# Patient Record
Sex: Male | Born: 1965 | Race: Black or African American | Hispanic: No | Marital: Married | Smoking: Current every day smoker
Health system: Southern US, Community
[De-identification: ages and names within clinical notes are randomized; demographics above are authoritative.]

## PROBLEM LIST (undated history)

## (undated) DIAGNOSIS — M4712 Other spondylosis with myelopathy, cervical region: Secondary | ICD-10-CM

## (undated) DIAGNOSIS — R748 Abnormal levels of other serum enzymes: Secondary | ICD-10-CM

## (undated) DIAGNOSIS — E119 Type 2 diabetes mellitus without complications: Secondary | ICD-10-CM

## (undated) DIAGNOSIS — E785 Hyperlipidemia, unspecified: Secondary | ICD-10-CM

## (undated) DIAGNOSIS — I10 Essential (primary) hypertension: Secondary | ICD-10-CM

## (undated) HISTORY — PX: OTHER SURGICAL HISTORY: SHX169

---

## 2017-01-23 ENCOUNTER — Encounter (HOSPITAL_COMMUNITY): Payer: Self-pay | Admitting: Emergency Medicine

## 2017-01-23 ENCOUNTER — Emergency Department (HOSPITAL_COMMUNITY)
Admission: EM | Admit: 2017-01-23 | Discharge: 2017-01-23 | Disposition: A | Discharge status: Home / Self Care | Payer: Medicaid Other | Attending: Emergency Medicine | Admitting: Emergency Medicine

## 2017-01-23 DIAGNOSIS — F17210 Nicotine dependence, cigarettes, uncomplicated: Secondary | ICD-10-CM | POA: Insufficient documentation

## 2017-01-23 DIAGNOSIS — F10230 Alcohol dependence with withdrawal, uncomplicated: Secondary | ICD-10-CM | POA: Insufficient documentation

## 2017-01-23 DIAGNOSIS — F1020 Alcohol dependence, uncomplicated: Secondary | ICD-10-CM

## 2017-01-23 MED ORDER — CHLORDIAZEPOXIDE HCL 25 MG PO CAPS: ORAL_CAPSULE | ORAL | 0 refills | Status: DC

## 2017-01-23 MED ORDER — NICOTINE 21 MG/24HR TD PT24: 21.0000 mg | MEDICATED_PATCH | Freq: Every day | TRANSDERMAL | 0 refills | Status: DC

## 2017-01-23 NOTE — ED Provider Notes (Signed)
[facility]-EMERGENCY DEPT Provider Note   CSN: [id number] Arrival date & time: 01/23/17  1404  By signing my name below, I, Rosario Adie, attest that this documentation has been prepared under the direction and in the presence of [facility], PA-C.  Electronically Signed: Rosario Adie, ED Scribe. 01/23/17. 2:57 PM.  History   Chief Complaint Chief Complaint  Patient presents with  . detox   The history is provided by the patient and a friend. No language interpreter was used.    HPI Comments: Marcus James is a 51 y.o. male with no pertinent PMHx, who presents to the Emergency Department requesting detoxification from alcohol and nicotine. Per pt's pastor, he is bringing him in today because he has been drinking increased amounts of alcohol and smoking more frequently and this has been causing the pt to be have spells of anger and is motivated to not live his life this way anymore.  Is causing problems with his home life, prompting him to ask him to bring him to ED. Per pt, he drinks approximately four 40oz bottles of beer a day and he smokes approximately 1 ppd. He has previously attempted to detox for a small amount of time, and this has caused some unsteadiness and tremors; he denies any of these symptoms presently. His last drink was earlier this morning at 8AM (7 hours ago). Pt is currently followed by a PCP. He denies SI/HI, seizures, or any other associated symptoms.   History reviewed. No pertinent past medical history.  There are no active problems to display for this patient.  History reviewed. No pertinent surgical history.  Home Medications    Prior to Admission medications   Medication Sig Start Date End Date Taking? Authorizing Provider  chlordiazePOXIDE (LIBRIUM) 25 MG capsule  PO TID x 2D, then 25-50mg  PO BID X 1D, then 25-50mg  PO QD X 1D 01/23/17   Trixie Dredge, PA-C  nicotine (NICODERM CQ - DOSED IN MG/24 HOURS) 21 mg/24hr patch Place 1 patch (21 mg total)  onto the skin daily. 01/23/17   Trixie Dredge, PA-C   Family History History reviewed. No pertinent family history.  Social History Social History  Substance Use Topics  . Smoking status: Current Every Day Smoker  . Smokeless tobacco: Never Used  . Alcohol use Yes   Allergies   Patient has no known allergies.  Review of Systems Review of Systems  Constitutional: Negative for activity change.  Gastrointestinal: Negative for nausea and vomiting.  Musculoskeletal: Negative for myalgias.  Neurological: Negative for tremors and seizures.  Psychiatric/Behavioral: Positive for agitation and dysphoric mood. Negative for suicidal ideas. The patient is not nervous/anxious.    Physical Exam Updated Vital Signs BP 135/87 (BP Location: Left Arm)   Pulse 92   Temp 98.7 F (37.1 C) (Oral)   Resp 16   Ht  (1.575 m)   Wt 51.9 kg   SpO2 98%   BMI 20.93 kg/m   Physical Exam  Constitutional: He appears well-developed and well-nourished. No distress.  HENT:  Head: Normocephalic and atraumatic.  Neck: Neck supple.  Cardiovascular: Normal rate, regular rhythm and normal heart sounds.   No murmur heard. Pulmonary/Chest: Effort normal and breath sounds normal. No respiratory distress. He has no wheezes. He has no rales.  Abdominal: Soft. He exhibits no distension and no mass. There is no tenderness. There is no rebound and no guarding.  Neurological: He is alert. He exhibits normal muscle tone.  Skin: He is not diaphoretic.  Nursing note and vitals reviewed.  ED Treatments / Results  DIAGNOSTIC STUDIES: Oxygen Saturation is 98% on RA, normal by my interpretation.   COORDINATION OF CARE: 2:57 PM-Discussed next steps with pt. Pt verbalized understanding and is agreeable with the plan.   Labs (all labs ordered are listed, but only abnormal results are displayed) Labs Reviewed - No data to display  EKG  EKG Interpretation None      Radiology No results  found.  Procedures Procedures   Medications Ordered in ED Medications - No data to display  Initial Impression / Assessment and Plan / ED Course  I have reviewed the triage vital signs and the nursing notes.  Pertinent labs & imaging results that were available during my care of the patient were reviewed by me and considered in my medical decision making (see chart for details).     Afebrile, nontoxic patient with request for help with detox from alcohol and cigarettes.  Pt not currently withdrawing.  Have provided librium and resources with explanations on how to use this.  Also provided nicotine patch prescription.   D/C home with PCP follow up.  Discussed result, findings, treatment, and follow up  with patient.  Pt given return precautions.  Pt verbalizes understanding and agrees with plan.       Final Clinical Impressions(s) / ED Diagnoses   Final diagnoses:  Uncomplicated alcohol dependence (HCC)  Cigarette smoker   New Prescriptions Discharge Medication List as of 01/23/2017  3:09 PM    START taking these medications   Details  chlordiazePOXIDE (LIBRIUM) 25 MG capsule  PO TID x 2D, then 25-50mg  PO BID X 1D, then 25-50mg  PO QD X 1D, Print    nicotine (NICODERM CQ - DOSED IN MG/24 HOURS) 21 mg/24hr patch Place 1 patch (21 mg total) onto the skin daily., Starting Tue 01/23/2017, Print       I personally performed the services described in this documentation, which was scribed in my presence. The recorded information has been reviewed and is accurate.    Trixie Dredge, PA-C 01/23/17 2043    Doug Sou, MD 01/23/17 2350

## 2017-01-23 NOTE — ED Triage Notes (Signed)
Pt wants detox from etoh and cigarrettes. No SI/HI. Last drink this am. Pt ambulatory to triage.

## 2017-01-23 NOTE — Discharge Instructions (Signed)
Read the information below.  Use the prescribed medication as directed.  Please discuss all new medications with your pharmacist.  You may return to the Emergency Department at any time for worsening condition or any new symptoms that concern you.  If you develop severe withdrawal symptoms including uncontrolled shaking, vomiting, or if you develop seizures or hallucinations, call 911 or return immediately to the Emergency Department.

## 2017-01-28 ENCOUNTER — Emergency Department (HOSPITAL_COMMUNITY)
Admission: EM | Admit: 2017-01-28 | Discharge: 2017-01-29 | Disposition: A | Discharge status: Home / Self Care | Payer: Medicaid Other | Attending: Emergency Medicine | Admitting: Emergency Medicine

## 2017-01-28 ENCOUNTER — Emergency Department (HOSPITAL_COMMUNITY): Payer: Medicaid Other

## 2017-01-28 DIAGNOSIS — Y92481 Parking lot as the place of occurrence of the external cause: Secondary | ICD-10-CM | POA: Diagnosis not present

## 2017-01-28 DIAGNOSIS — F17200 Nicotine dependence, unspecified, uncomplicated: Secondary | ICD-10-CM | POA: Diagnosis not present

## 2017-01-28 DIAGNOSIS — S90812A Abrasion, left foot, initial encounter: Secondary | ICD-10-CM | POA: Insufficient documentation

## 2017-01-28 DIAGNOSIS — T07XXXA Unspecified multiple injuries, initial encounter: Secondary | ICD-10-CM

## 2017-01-28 DIAGNOSIS — S1081XA Abrasion of other specified part of neck, initial encounter: Secondary | ICD-10-CM | POA: Insufficient documentation

## 2017-01-28 DIAGNOSIS — S0240DA Maxillary fracture, left side, initial encounter for closed fracture: Secondary | ICD-10-CM | POA: Diagnosis not present

## 2017-01-28 DIAGNOSIS — W010XXA Fall on same level from slipping, tripping and stumbling without subsequent striking against object, initial encounter: Secondary | ICD-10-CM | POA: Insufficient documentation

## 2017-01-28 DIAGNOSIS — Y9302 Activity, running: Secondary | ICD-10-CM | POA: Diagnosis not present

## 2017-01-28 DIAGNOSIS — S60512A Abrasion of left hand, initial encounter: Secondary | ICD-10-CM | POA: Diagnosis not present

## 2017-01-28 DIAGNOSIS — W19XXXA Unspecified fall, initial encounter: Secondary | ICD-10-CM

## 2017-01-28 DIAGNOSIS — S81012A Laceration without foreign body, left knee, initial encounter: Secondary | ICD-10-CM | POA: Insufficient documentation

## 2017-01-28 DIAGNOSIS — S40812A Abrasion of left upper arm, initial encounter: Secondary | ICD-10-CM | POA: Insufficient documentation

## 2017-01-28 DIAGNOSIS — S0292XA Unspecified fracture of facial bones, initial encounter for closed fracture: Secondary | ICD-10-CM

## 2017-01-28 DIAGNOSIS — Y999 Unspecified external cause status: Secondary | ICD-10-CM | POA: Insufficient documentation

## 2017-01-28 DIAGNOSIS — S0990XA Unspecified injury of head, initial encounter: Secondary | ICD-10-CM | POA: Diagnosis present

## 2017-01-28 LAB — CBC WITH DIFFERENTIAL/PLATELET
BASOS PCT: 0 %
Basophils Absolute: 0 10*3/uL (ref 0.0–0.1)
Eosinophils Absolute: 0.1 10*3/uL (ref 0.0–0.7)
Eosinophils Relative: 1 %
HEMATOCRIT: 45.4 % (ref 39.0–52.0)
Hemoglobin: 15.3 g/dL (ref 13.0–17.0)
Lymphocytes Relative: 25 %
Lymphs Abs: 2.2 10*3/uL (ref 0.7–4.0)
MCH: 29.9 pg (ref 26.0–34.0)
MCHC: 33.7 g/dL (ref 30.0–36.0)
MCV: 88.8 fL (ref 78.0–100.0)
MONO ABS: 0.5 10*3/uL (ref 0.1–1.0)
MONOS PCT: 6 %
NEUTROS ABS: 6.1 10*3/uL (ref 1.7–7.7)
Neutrophils Relative %: 68 %
Platelets: 190 10*3/uL (ref 150–400)
RBC: 5.11 MIL/uL (ref 4.22–5.81)
RDW: 13.5 % (ref 11.5–15.5)
WBC: 8.9 10*3/uL (ref 4.0–10.5)

## 2017-01-28 LAB — ETHANOL: Alcohol, Ethyl (B): 103 mg/dL — ABNORMAL HIGH (ref <5)

## 2017-01-28 LAB — COMPREHENSIVE METABOLIC PANEL
ALBUMIN: 3.6 g/dL (ref 3.5–5.0)
ALK PHOS: 118 U/L (ref 38–126)
ALT: 26 U/L (ref 17–63)
AST: 38 U/L (ref 15–41)
Anion gap: 12 (ref 5–15)
BILIRUBIN TOTAL: 0.4 mg/dL (ref 0.3–1.2)
BUN: 8 mg/dL (ref 6–20)
CO2: 22 mmol/L (ref 22–32)
Calcium: 9 mg/dL (ref 8.9–10.3)
Chloride: 106 mmol/L (ref 101–111)
Creatinine, Ser: 0.93 mg/dL (ref 0.61–1.24)
GFR calc Af Amer: >60 mL/min (ref >60)
GFR calc non Af Amer: >60 mL/min (ref >60)
GLUCOSE: 90 mg/dL (ref 65–99)
POTASSIUM: 4.5 mmol/L (ref 3.5–5.1)
Sodium: 140 mmol/L (ref 135–145)
TOTAL PROTEIN: 7.9 g/dL (ref 6.5–8.1)

## 2017-01-28 IMAGING — CT CT CERVICAL SPINE W/O CM
3 of 12 series · 7 of 33 positions shown, 8 images · non-contrast
Comparison: None.

CLINICAL DATA: Initial evaluation for acute trauma, fall.

EXAM:
CT HEAD WITHOUT CONTRAST
CT MAXILLOFACIAL WITHOUT CONTRAST
CT CERVICAL SPINE WITHOUT CONTRAST
TECHNIQUE: Multidetector CT imaging of the head, cervical spine, and
maxillofacial structures were performed using the standard protocol
without intravenous contrast. Multiplanar CT image reconstructions
of the cervical spine and maxillofacial structures were also
generated.

[Series 301: facial bones, idose (1) · axial · 0.34mm/px · z∈[+633,+795]mm · 3 of 82 slices shown, 4 images]
[im 1/82  soft-tissue]
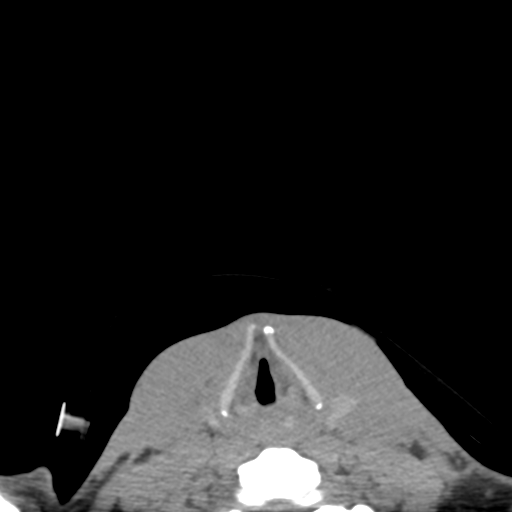
[im 1/82  bone]
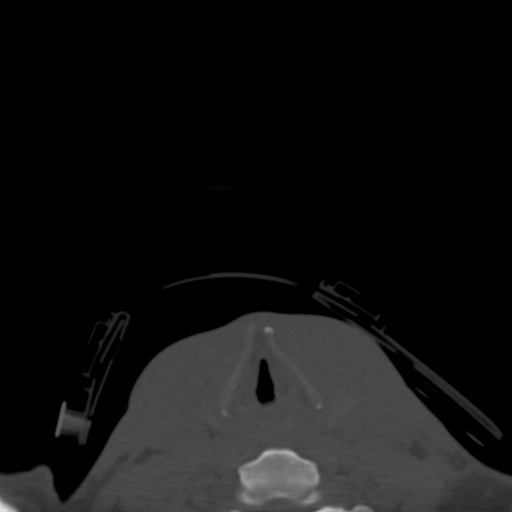
[im 41/82  bone]
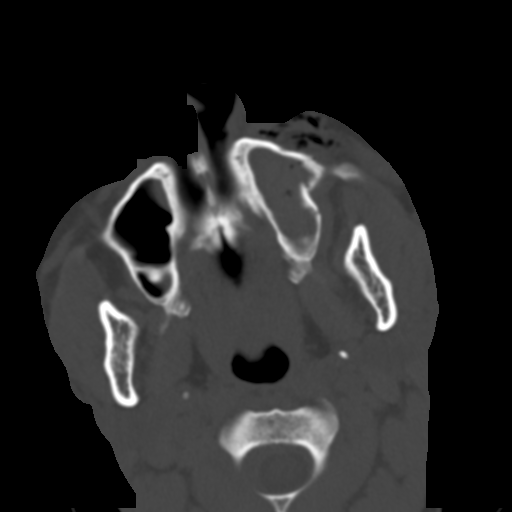
[im 82/82  bone]
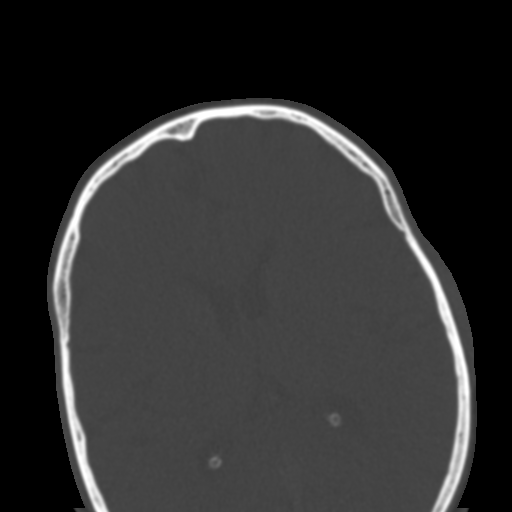

[Series 306: sagittal bone, idose (1) · sagittal · 0.34mm/px · 2 of 81 slices shown]
[im 27/81  bone]
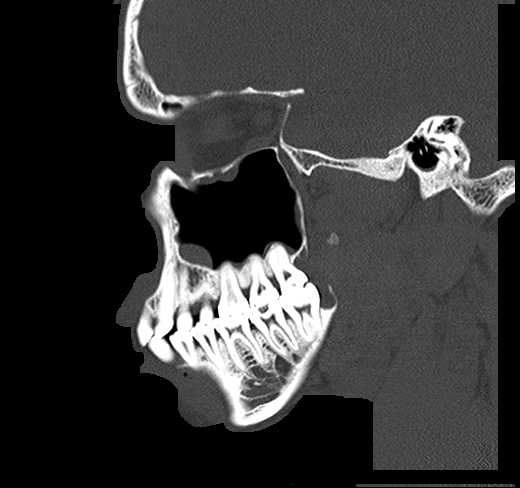
[im 54/81  bone]
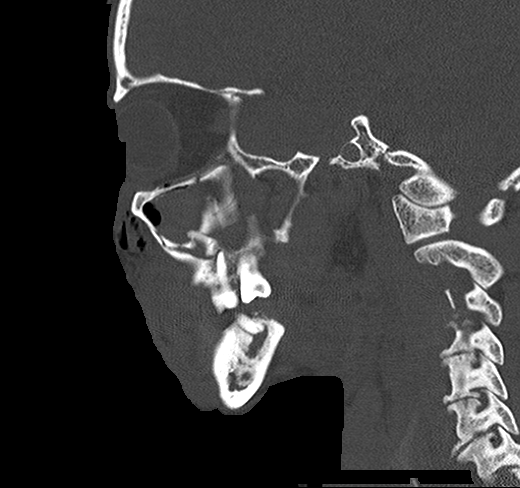

[Series 402: soft tissue, idose (2) · axial · 0.34mm/px · z∈[+653,+715]mm · 2 of 95 slices shown]
[im 32/95  soft-tissue]
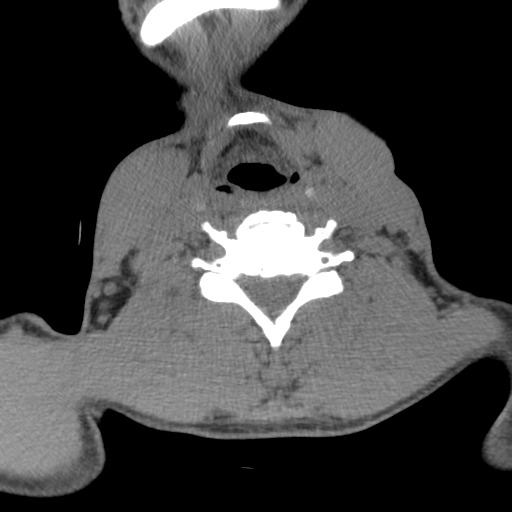
[im 63/95  soft-tissue]
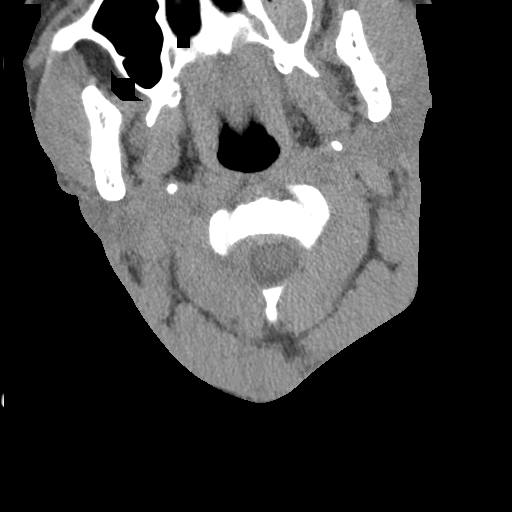

[7 of 33 positions shown; findings below may reference images not displayed]

FINDINGS: CT HEAD FINDINGS

Brain: Age-appropriate cerebral atrophy. Minimal chronic
microvascular changes present within the periventricular white
matter.

No acute intracranial hemorrhage. No findings to suggest acute large
vessel territory infarct. No mass lesion, midline shift or mass
effect. No hydrocephalus. No extra-axial fluid collection.

Vascular: No hyperdense vessel.

Skull: Soft tissue contusion with laceration present at the left
frontal/ temporal scalp. Scalp soft tissues otherwise unremarkable.
Calvarium intact.

Other: No mastoid effusion.

CT MAXILLOFACIAL FINDINGS

Osseous: Acute comminuted fracture of the left zygomatic arch. There
are acute minimally displaced fractures extending to the anterior
posterior walls of the left maxillary sinus. Extension into the left
orbital floor with involvement of the left infraorbital foramen.
Constellation of findings consistent with a it acute tripod
fracture.

The

Right zygomatic arch and maxilla are intact. Pterygoid plates
intact. No nasal bone fracture. Nasal septum mildly bowed to the
right but intact. The no acute mandibular fracture. Mandibular
condyles normally situated. No acute abnormality about the
dentition. Scattered dental caries noted.

Orbits: Acute nondisplaced left orbital floor fracture with
extension through the left infraorbital foramen. Few scattered foci
of soft tissue emphysema with straightening present within the
inferior left orbit. Extra-ocular muscles remain normally positioned
within the bony orbit. Bony orbits otherwise intact. Globes intact.
No retro-orbital hematoma or other pathology.

Sinuses: The blood fills the left maxillary sinus. Scattered mucosal
thickening within the ethmoidal air cells, sphenoid sinuses, and
right maxillary sinus.

Soft tissues: Soft tissue contusion with emphysema present within
the left periorbital region and left face.

CT CERVICAL SPINE FINDINGS

Alignment: Straightening with slight reversal of the normal cervical
lordosis. No listhesis.

Skull base and vertebrae: Skullbase intact. Mild rotation of C1 on
C2 likely positional. Dens is intact. Vertebral body heights
maintained. No acute fracture.

Soft tissues and spinal canal: Visualized paraspinous soft tissues
demonstrate no acute abnormality. No prevertebral edema.

Disc levels: Moderate degenerative spondylolysis present at C3-4
through C6-7. Right-sided facet arthrosis noted at C7-T1.

Upper chest: Visualized upper chest within normal limits. No apical
pneumothorax. The the
IMPRESSION: CT HEAD:

1. No acute intracranial process identified.
2. Left frontotemporal scalp contusion/laceration.

CT MAXILLOFACIAL:

1. Acute left facial tripod fracture as detailed above. Intact
globes with no retro-orbital hematoma or other pathology.
2. Left facial contusion/laceration.

CT CERVICAL SPINE:

1. No acute traumatic injury within the cervical spine.
2. Moderate degenerative spondylolysis at C3-4 through C6-7.

## 2017-01-28 MED ORDER — LORAZEPAM 1 MG PO TABS: 0.0000 mg | ORAL_TABLET | Freq: Four times a day (QID) | ORAL | Status: DC

## 2017-01-28 MED ORDER — CHLORDIAZEPOXIDE HCL 25 MG PO CAPS
100.0000 mg | ORAL_CAPSULE | Freq: Once | ORAL | Status: AC
  Administered 2017-01-29: 100 mg via ORAL
  Filled 2017-01-28: qty 4

## 2017-01-28 MED ORDER — LORAZEPAM 2 MG/ML IJ SOLN
INTRAMUSCULAR | Status: AC
  Filled 2017-01-28: qty 1

## 2017-01-28 MED ORDER — LORAZEPAM 2 MG/ML IJ SOLN
1.0000 mg | Freq: Once | INTRAMUSCULAR | Status: DC
  Filled 2017-01-28: qty 1

## 2017-01-28 MED ORDER — SODIUM CHLORIDE 0.9 % IV BOLUS (SEPSIS)
1000.0000 mL | Freq: Once | INTRAVENOUS | Status: AC
  Administered 2017-01-28: 1000 mL via INTRAVENOUS

## 2017-01-28 MED ORDER — LORAZEPAM 1 MG PO TABS: 0.0000 mg | ORAL_TABLET | Freq: Two times a day (BID) | ORAL | Status: DC

## 2017-01-28 MED ORDER — TETANUS-DIPHTH-ACELL PERTUSSIS 5-2.5-18.5 LF-MCG/0.5 IM SUSP
0.5000 mL | Freq: Once | INTRAMUSCULAR | Status: AC
  Administered 2017-01-28: 0.5 mL via INTRAMUSCULAR
  Filled 2017-01-28: qty 0.5

## 2017-01-28 MED ORDER — FENTANYL CITRATE (PF) 100 MCG/2ML IJ SOLN
25.0000 ug | Freq: Once | INTRAMUSCULAR | Status: AC
  Administered 2017-01-28: 25 ug via INTRAVENOUS
  Filled 2017-01-28: qty 2

## 2017-01-28 MED ORDER — OXYCODONE-ACETAMINOPHEN 5-325 MG PO TABS: 1.0000 | ORAL_TABLET | Freq: Four times a day (QID) | ORAL | 0 refills | Status: DC | PRN

## 2017-01-28 MED ORDER — LORAZEPAM 2 MG/ML IJ SOLN
0.5000 mg | Freq: Once | INTRAMUSCULAR | Status: AC
  Administered 2017-01-28: 0.5 mg via INTRAVENOUS

## 2017-01-28 MED ORDER — LORAZEPAM 2 MG/ML IJ SOLN
1.0000 mg | Freq: Once | INTRAMUSCULAR | Status: AC
  Administered 2017-01-28: 1 mg via INTRAVENOUS

## 2017-01-28 NOTE — Discharge Instructions (Signed)
Your fall has resulted in multiple facial bone fractures.  It is important that you monitor your condition carefully, and be sure to follow-up with our facial trauma specialist.  Equally important is that you monitor your condition carefully, keep your wounds clean, dry.  Return here for concerning changes in your condition.

## 2017-01-28 NOTE — BH Assessment (Signed)
Followed up with pt's nurse to see if the pt was ready for a consult.  The nurse stated the pt was still sleeping.  It was suggested by the nurse the TTS consult happen in the am due to the state of the client.

## 2017-01-28 NOTE — BHH Counselor (Signed)
Attempted consult.  Pt is very groggy, eyes closed.  Family is present in room and attempting to dictate Pt's responses (when asked if Pt wanted to stay in the hospital, a family member said "Yes" and would not ask Pt).  It appears Pt does not speak English well (per hospital notes, he can speak English slowly), and family members spoke to him and for him in [place] and [place].  It was unclear during conversation whether family was accurately translating for or to Pt.  Given Pt's groggy state and interference from family, recommended to [facility] nurse that Pt's consult be pulled and put in again after Pt is alert and family is gone.

## 2017-01-28 NOTE — ED Notes (Signed)
Wife at the bedside.  Explained procedure for TTS assessment.  Waiting for physician to repair lac to lip then will move patient to Pod F.  Saline bolus and ativan 0.5mg  IV given due to tachycardia.

## 2017-01-28 NOTE — ED Triage Notes (Signed)
Patient from home with [facility] for ground level fall.  Patient was running on pavement and slipped, abrasions to hands, feet, and face.  Alert and oriented. 18g saline lock in left AC.  Patient in no apparent distress at this time.  History of ETOH abuse, patient states he has been trying to detox.  States he has had "one small bottle of 'ice' this morning".

## 2017-01-28 NOTE — ED Notes (Signed)
Wounds on hands and feet cleaned with water and wrapped in gauze.
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
## 2017-01-29 MED ORDER — SODIUM CHLORIDE 0.9 % IV BOLUS (SEPSIS)
1000.0000 mL | Freq: Once | INTRAVENOUS | Status: AC
  Administered 2017-01-29: 1000 mL via INTRAVENOUS

## 2017-01-29 MED ORDER — OXYCODONE-ACETAMINOPHEN 5-325 MG PO TABS: 1.0000 | ORAL_TABLET | Freq: Four times a day (QID) | ORAL | 0 refills | Status: DC | PRN

## 2017-01-29 MED ORDER — ACETAMINOPHEN 500 MG PO TABS: 1000.0000 mg | ORAL_TABLET | Freq: Four times a day (QID) | ORAL | Status: DC | PRN

## 2017-01-29 MED ORDER — IBUPROFEN 800 MG PO TABS
800.0000 mg | ORAL_TABLET | Freq: Four times a day (QID) | ORAL | Status: DC | PRN
  Administered 2017-01-29: 800 mg via ORAL
  Filled 2017-01-29: qty 1

## 2017-01-29 MED ORDER — LIDOCAINE HCL (PF) 1 % IJ SOLN
30.0000 mL | Freq: Once | INTRAMUSCULAR | Status: AC
  Administered 2017-01-29: 30 mL via INTRADERMAL
  Filled 2017-01-29: qty 30
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
## 2017-01-29 NOTE — [facility] Counselor (Signed)
[facility] Assessment Progress Note  Clinician spoke to pt's RN, Steward Drone, this morning concerning need for psychiatric crisis assessment as there is no indication of pt being suicidal, homicidal, or psychotic. It appears that pt is requesting a detox or rehab treatment program and resources can be given to pt for that. Steward Drone will call the EDP and speak with them about the case and call clinician back.   Johny Shock. Ladona Ridgel, MS, NCC, LPCA Counselor

## 2017-01-29 NOTE — ED Notes (Signed)
D/c instructions given to pt and pt's wife in [place] using pacific interpreters. Both pt and pt's wife's questions and concerns were addressed as well as follow up information and prescription information. Pt's wife encouraged to write down important pieces of information in Swahili on d/c instructions to ensure she did not forget.

## 2017-01-29 NOTE — ED Notes (Signed)
TTS being done using  interpreter monitor

## 2017-01-29 NOTE — ED Provider Notes (Signed)
LACERATION REPAIR Performed by: Elpidio Anis A Authorized by: Elpidio Anis A Consent: Verbal consent obtained. Risks and benefits: risks, benefits and alternatives were discussed Consent given by: patient Patient identity confirmed: provided demographic data Prepped and Draped in normal sterile fashion Wound explored  Laceration Location: left upper lip  Laceration Length: 1.5 cm, flap  No Foreign Bodies seen or palpated  Anesthesia: local infiltration  Local anesthetic: lidocaine 1% w/o epinephrine  Anesthetic total: 1 ml  Irrigation method: syringe Amount of cleaning: standard  Skin closure: 6-0 prolene  Number of sutures: 3  Technique: simple interrupted  Patient tolerance: Patient tolerated the procedure well with no immediate complications.    Elpidio Anis, PA-C 01/29/17 0105    Melene Plan, DO 01/29/17 1610

## 2017-01-29 NOTE — ED Notes (Signed)
Pt waking up. AO x 4.  Family at bedside.  States L jaw pain and R leg pain.  Difficulty chewing d/t pain.
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
## 2017-01-29 NOTE — ED Notes (Signed)
p tsat up in bed to have liquid diet

## 2017-02-04 ENCOUNTER — Emergency Department (HOSPITAL_COMMUNITY)
Admission: EM | Admit: 2017-02-04 | Discharge: 2017-02-04 | Disposition: A | Discharge status: Home / Self Care | Payer: Medicaid Other | Attending: Emergency Medicine | Admitting: Emergency Medicine

## 2017-02-04 ENCOUNTER — Emergency Department (HOSPITAL_COMMUNITY): Payer: Medicaid Other

## 2017-02-04 ENCOUNTER — Encounter (HOSPITAL_COMMUNITY): Payer: Self-pay | Admitting: Emergency Medicine

## 2017-02-04 DIAGNOSIS — L03116 Cellulitis of left lower limb: Secondary | ICD-10-CM | POA: Diagnosis not present

## 2017-02-04 DIAGNOSIS — F17200 Nicotine dependence, unspecified, uncomplicated: Secondary | ICD-10-CM | POA: Insufficient documentation

## 2017-02-04 DIAGNOSIS — Z79899 Other long term (current) drug therapy: Secondary | ICD-10-CM | POA: Insufficient documentation

## 2017-02-04 DIAGNOSIS — Z5189 Encounter for other specified aftercare: Secondary | ICD-10-CM

## 2017-02-04 DIAGNOSIS — M79672 Pain in left foot: Secondary | ICD-10-CM

## 2017-02-04 DIAGNOSIS — Z4802 Encounter for removal of sutures: Secondary | ICD-10-CM | POA: Diagnosis not present

## 2017-02-04 LAB — COMPREHENSIVE METABOLIC PANEL
ALBUMIN: 3.3 g/dL — AB (ref 3.5–5.0)
ALT: 18 U/L (ref 17–63)
AST: 29 U/L (ref 15–41)
Alkaline Phosphatase: 90 U/L (ref 38–126)
Anion gap: 5 (ref 5–15)
BUN: 8 mg/dL (ref 6–20)
CHLORIDE: 107 mmol/L (ref 101–111)
CO2: 27 mmol/L (ref 22–32)
CREATININE: 0.81 mg/dL (ref 0.61–1.24)
Calcium: 8.7 mg/dL — ABNORMAL LOW (ref 8.9–10.3)
GFR calc Af Amer: >60 mL/min (ref >60)
GFR calc non Af Amer: >60 mL/min (ref >60)
Glucose, Bld: 86 mg/dL (ref 65–99)
POTASSIUM: 4.5 mmol/L (ref 3.5–5.1)
SODIUM: 139 mmol/L (ref 135–145)
Total Bilirubin: 0.7 mg/dL (ref 0.3–1.2)
Total Protein: 7 g/dL (ref 6.5–8.1)

## 2017-02-04 LAB — CBC WITH DIFFERENTIAL/PLATELET
BASOS ABS: 0.1 10*3/uL (ref 0.0–0.1)
BASOS PCT: 1 %
EOS ABS: 0.1 10*3/uL (ref 0.0–0.7)
EOS PCT: 3 %
HCT: 41.5 % (ref 39.0–52.0)
Hemoglobin: 13.4 g/dL (ref 13.0–17.0)
LYMPHS PCT: 38 %
Lymphs Abs: 2 10*3/uL (ref 0.7–4.0)
MCH: 28.5 pg (ref 26.0–34.0)
MCHC: 32.3 g/dL (ref 30.0–36.0)
MCV: 88.1 fL (ref 78.0–100.0)
MONO ABS: 0.4 10*3/uL (ref 0.1–1.0)
Monocytes Relative: 7 %
Neutro Abs: 2.7 10*3/uL (ref 1.7–7.7)
Neutrophils Relative %: 51 %
PLATELETS: 319 10*3/uL (ref 150–400)
RBC: 4.71 MIL/uL (ref 4.22–5.81)
RDW: 13.3 % (ref 11.5–15.5)
WBC: 5.3 10*3/uL (ref 4.0–10.5)

## 2017-02-04 LAB — I-STAT CG4 LACTIC ACID, ED: LACTIC ACID, VENOUS: 0.92 mmol/L (ref 0.5–1.9)
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
## 2017-02-04 MED ORDER — SODIUM CHLORIDE 0.9 % IV BOLUS (SEPSIS)
1000.0000 mL | Freq: Once | INTRAVENOUS | Status: AC
  Administered 2017-02-04: 1000 mL via INTRAVENOUS

## 2017-02-04 MED ORDER — DIPHENHYDRAMINE HCL 25 MG PO CAPS: 50.0000 mg | ORAL_CAPSULE | Freq: Once | ORAL | Status: DC

## 2017-02-04 MED ORDER — CLINDAMYCIN HCL 300 MG PO CAPS: 300.0000 mg | ORAL_CAPSULE | Freq: Three times a day (TID) | ORAL | 0 refills | Status: AC

## 2017-02-04 MED ORDER — BACITRACIN ZINC 500 UNIT/GM EX OINT: 1.0000 "application " | TOPICAL_OINTMENT | Freq: Two times a day (BID) | CUTANEOUS | 0 refills | Status: DC

## 2017-02-04 MED ORDER — CLINDAMYCIN HCL 300 MG PO CAPS: 300.0000 mg | ORAL_CAPSULE | Freq: Three times a day (TID) | ORAL | 0 refills | Status: DC

## 2017-02-04 NOTE — ED Triage Notes (Signed)
Pt. Here for removal of stitches on top of head and I have a foot problem
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
## 2017-02-04 NOTE — ED Notes (Signed)
Pt verbalized understanding discharge instructions and denies any further needs or questions at this time. VS stable, ambulatory and steady gait.   

## 2017-02-04 NOTE — Discharge Instructions (Signed)
Please call Dr. Logan Bores for a visit for your foot.  Clean your wounds daily with soap and warm water.    Take your clindamycin (antibiotic) daily

## 2017-02-04 NOTE — ED Notes (Signed)
Patient transported to X-ray 

## 2017-02-07 ENCOUNTER — Emergency Department (HOSPITAL_COMMUNITY)
Admission: EM | Admit: 2017-02-07 | Discharge: 2017-02-07 | Disposition: A | Discharge status: Home / Self Care | Payer: Medicaid Other | Attending: Emergency Medicine | Admitting: Emergency Medicine

## 2017-02-07 ENCOUNTER — Emergency Department (HOSPITAL_COMMUNITY): Payer: Medicaid Other

## 2017-02-07 ENCOUNTER — Encounter (HOSPITAL_COMMUNITY): Payer: Self-pay | Admitting: *Deleted

## 2017-02-07 DIAGNOSIS — S0990XD Unspecified injury of head, subsequent encounter: Secondary | ICD-10-CM | POA: Diagnosis present

## 2017-02-07 DIAGNOSIS — F17200 Nicotine dependence, unspecified, uncomplicated: Secondary | ICD-10-CM | POA: Diagnosis not present

## 2017-02-07 DIAGNOSIS — R51 Headache: Secondary | ICD-10-CM | POA: Insufficient documentation

## 2017-02-07 DIAGNOSIS — L089 Local infection of the skin and subcutaneous tissue, unspecified: Secondary | ICD-10-CM | POA: Diagnosis not present

## 2017-02-07 DIAGNOSIS — R519 Headache, unspecified: Secondary | ICD-10-CM

## 2017-02-07 DIAGNOSIS — M545 Low back pain, unspecified: Secondary | ICD-10-CM

## 2017-02-07 DIAGNOSIS — S91109D Unspecified open wound of unspecified toe(s) without damage to nail, subsequent encounter: Secondary | ICD-10-CM

## 2017-02-07 DIAGNOSIS — S91102D Unspecified open wound of left great toe without damage to nail, subsequent encounter: Secondary | ICD-10-CM | POA: Insufficient documentation

## 2017-02-07 DIAGNOSIS — M79672 Pain in left foot: Secondary | ICD-10-CM

## 2017-02-07 DIAGNOSIS — S060X1D Concussion with loss of consciousness of 30 minutes or less, subsequent encounter: Secondary | ICD-10-CM | POA: Diagnosis not present

## 2017-02-07 LAB — COMPREHENSIVE METABOLIC PANEL
ALBUMIN: 3.7 g/dL (ref 3.5–5.0)
ALT: 16 U/L — AB (ref 17–63)
ANION GAP: 10 (ref 5–15)
AST: 31 U/L (ref 15–41)
Alkaline Phosphatase: 108 U/L (ref 38–126)
BUN: 5 mg/dL — ABNORMAL LOW (ref 6–20)
CHLORIDE: 105 mmol/L (ref 101–111)
CO2: 24 mmol/L (ref 22–32)
CREATININE: 0.83 mg/dL (ref 0.61–1.24)
Calcium: 9 mg/dL (ref 8.9–10.3)
GFR calc non Af Amer: >60 mL/min (ref >60)
GLUCOSE: 80 mg/dL (ref 65–99)
Potassium: 4.3 mmol/L (ref 3.5–5.1)
Sodium: 139 mmol/L (ref 135–145)
Total Bilirubin: 0.7 mg/dL (ref 0.3–1.2)
Total Protein: 7.4 g/dL (ref 6.5–8.1)

## 2017-02-07 LAB — CBC WITH DIFFERENTIAL/PLATELET
Basophils Absolute: 0.1 10*3/uL (ref 0.0–0.1)
Basophils Relative: 1 %
Eosinophils Absolute: 0.1 10*3/uL (ref 0.0–0.7)
Eosinophils Relative: 2 %
HEMATOCRIT: 42 % (ref 39.0–52.0)
HEMOGLOBIN: 14.3 g/dL (ref 13.0–17.0)
LYMPHS ABS: 2.3 10*3/uL (ref 0.7–4.0)
Lymphocytes Relative: 40 %
MCH: 30.2 pg (ref 26.0–34.0)
MCHC: 34 g/dL (ref 30.0–36.0)
MCV: 88.6 fL (ref 78.0–100.0)
MONO ABS: 0.4 10*3/uL (ref 0.1–1.0)
MONOS PCT: 7 %
NEUTROS ABS: 2.8 10*3/uL (ref 1.7–7.7)
Neutrophils Relative %: 50 %
Platelets: 407 10*3/uL — ABNORMAL HIGH (ref 150–400)
RBC: 4.74 MIL/uL (ref 4.22–5.81)
RDW: 13.2 % (ref 11.5–15.5)
WBC: 5.7 10*3/uL (ref 4.0–10.5)

## 2017-02-07 LAB — I-STAT CG4 LACTIC ACID, ED
LACTIC ACID, VENOUS: 2.9 mmol/L — AB (ref 0.5–1.9)
LACTIC ACID, VENOUS: 1.22 mmol/L (ref 0.5–1.9)

## 2017-02-07 LAB — C-REACTIVE PROTEIN: CRP: 1.1 mg/dL — ABNORMAL HIGH (ref <1.0)

## 2017-02-07 LAB — SEDIMENTATION RATE: Sed Rate: 19 mm/hr — ABNORMAL HIGH (ref 0–16)

## 2017-02-07 IMAGING — DX DG FOOT COMPLETE 3+V*L*
3 series · 3 of 3 positions shown · non-contrast
Comparison: Right foot series of [DATE]

CLINICAL DATA: Pain redness and swelling of the left foot. None
healing wound over the great toe with known recent fracture.

EXAM:
LEFT FOOT - COMPLETE 3+ VIEW

[foot obl]
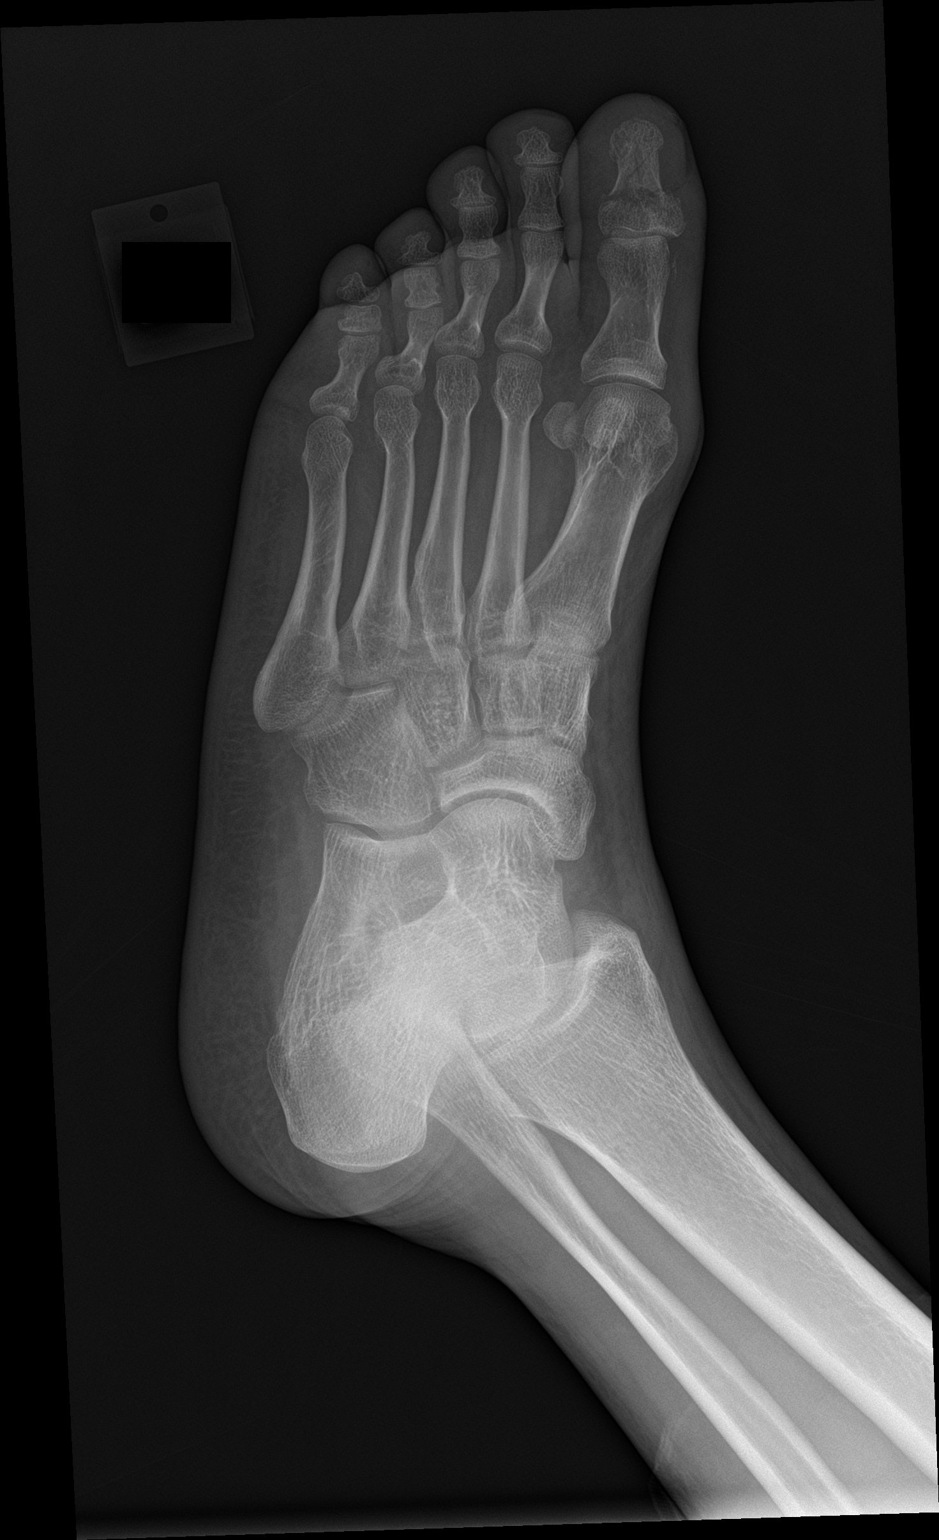

[foot lat]
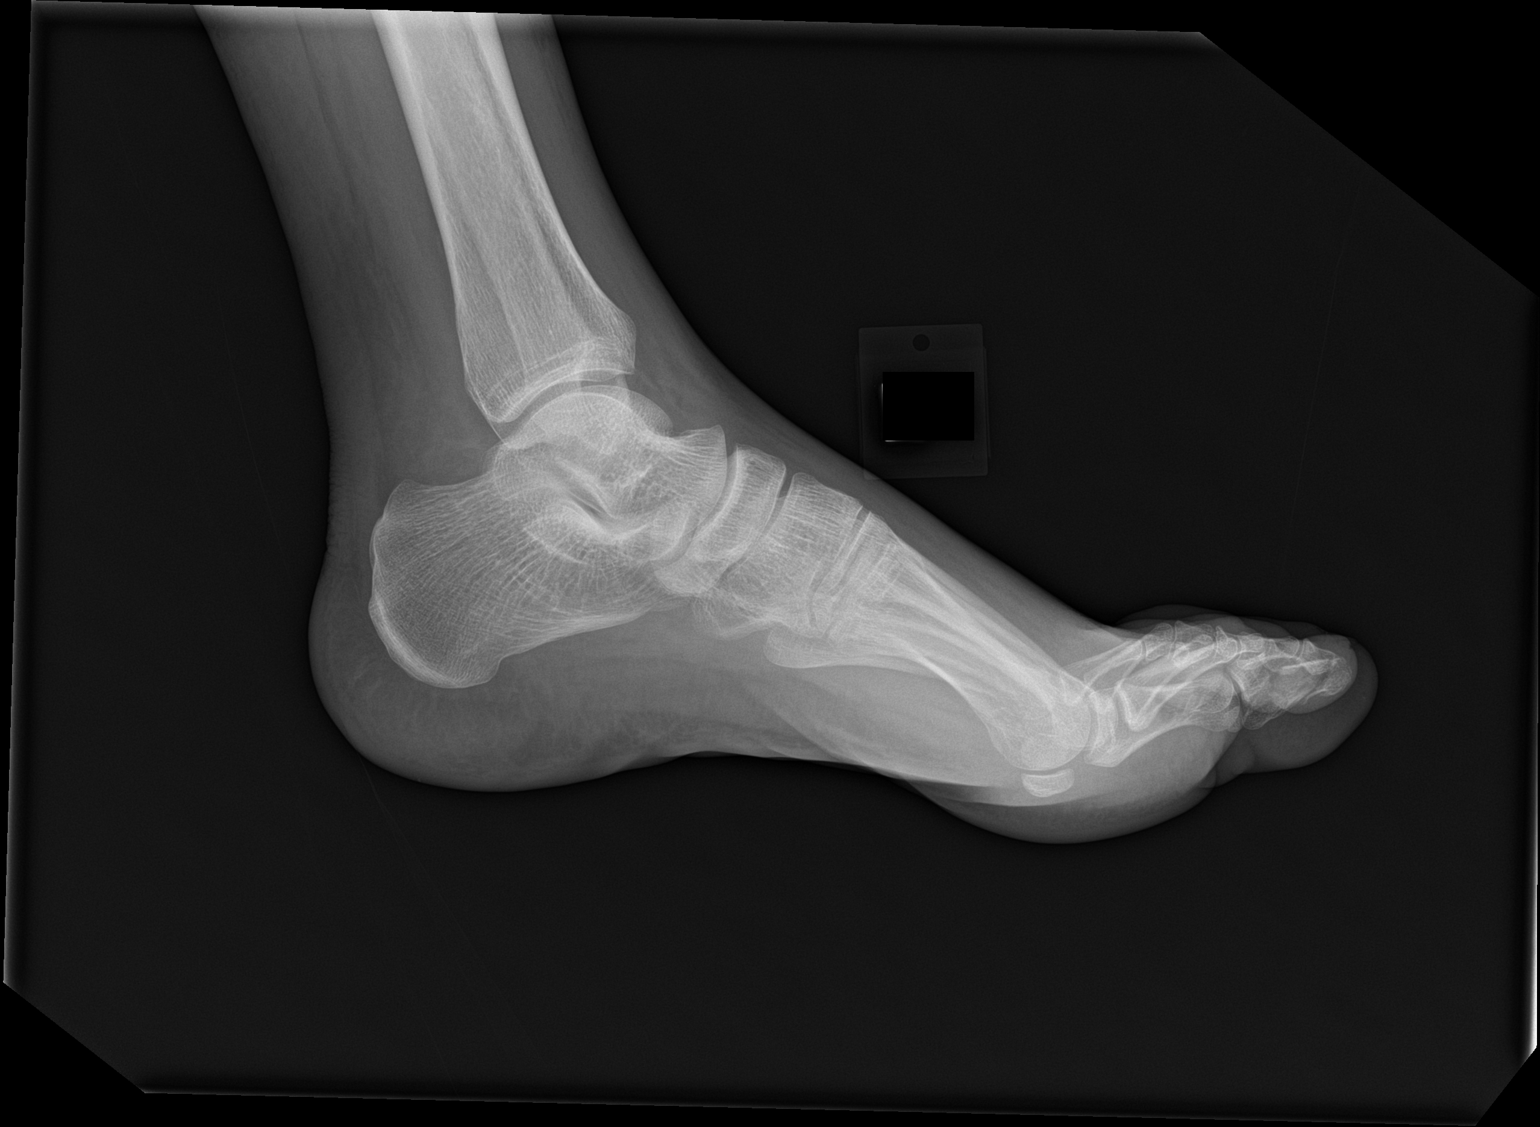

[foot ap]
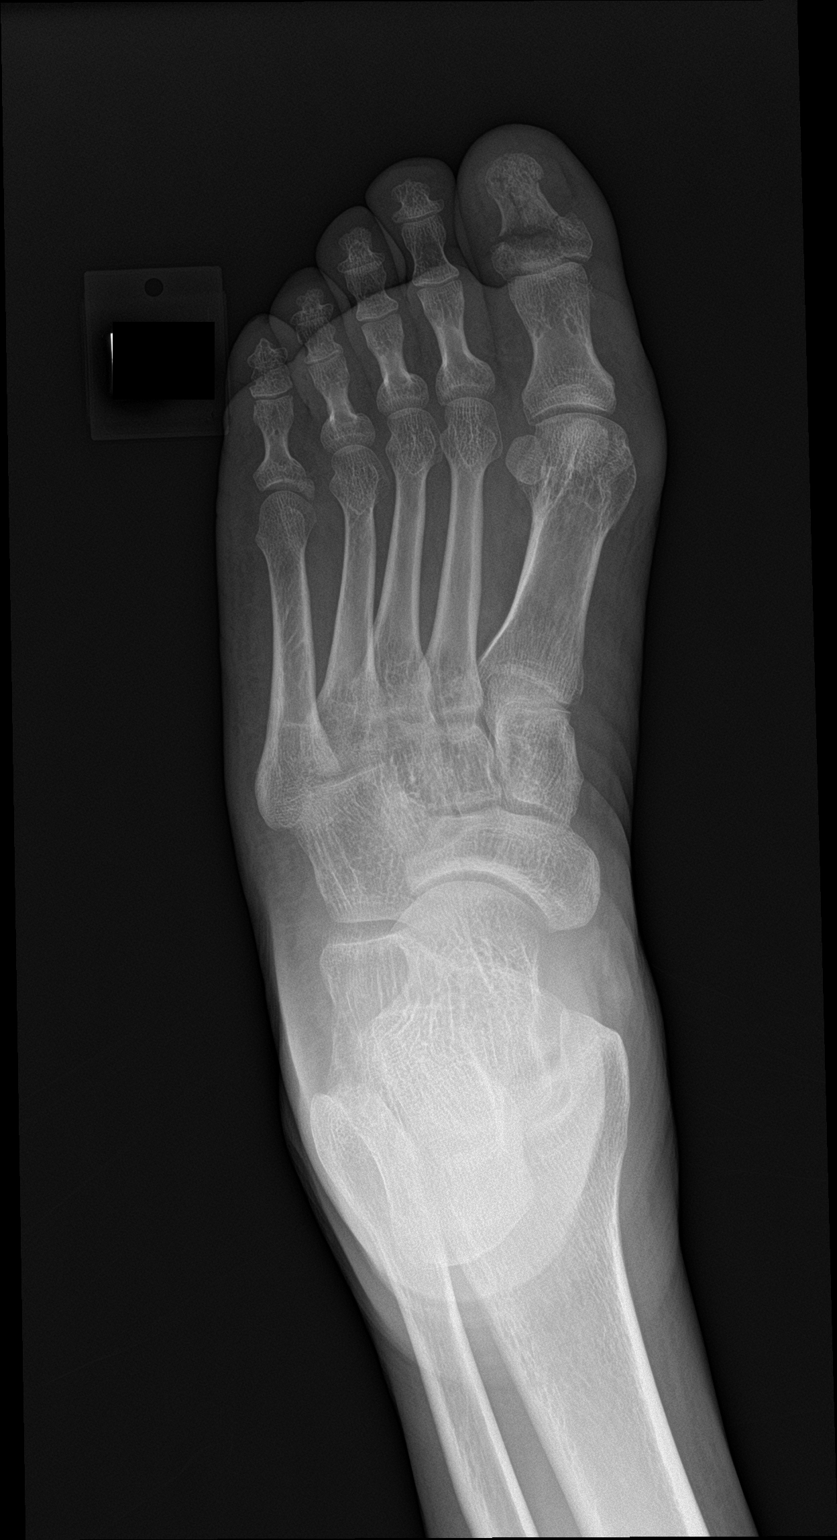

[3 of 3 positions shown; findings below may reference images not displayed]

FINDINGS: Again demonstrated is a comminuted mildly distracted fracture
involving the base of the shaft of the distal phalanx of the great
toe. A linear lucency extends longitudinally along the shaft to the
tuft. The IP joint space is preserved. The proximal phalanx is
unremarkable. There is diffuse soft tissue swelling.
IMPRESSION: No fracture of the distal phalanx of the great toe with no
significant change in appearance over the past 2 days.
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
## 2017-02-07 MED ORDER — HYDROCODONE-ACETAMINOPHEN 5-325 MG PO TABS
1.0000 | ORAL_TABLET | Freq: Once | ORAL | Status: AC
  Administered 2017-02-07: 1 via ORAL
  Filled 2017-02-07: qty 1

## 2017-02-07 MED ORDER — BACITRACIN ZINC 500 UNIT/GM EX OINT: 1.0000 "application " | TOPICAL_OINTMENT | Freq: Two times a day (BID) | CUTANEOUS | 0 refills | Status: DC

## 2017-02-07 MED ORDER — HYDROCODONE-ACETAMINOPHEN 5-325 MG PO TABS: 1.0000 | ORAL_TABLET | ORAL | 0 refills | Status: DC | PRN

## 2017-02-07 MED ORDER — SODIUM CHLORIDE 0.9 % IV BOLUS (SEPSIS)
1000.0000 mL | Freq: Once | INTRAVENOUS | Status: AC
  Administered 2017-02-07: 1000 mL via INTRAVENOUS

## 2017-02-07 NOTE — ED Notes (Signed)
Asked the pt if he can urinate.  Pt states that he is unable to at this moment.  He acknowledges that we need a urine sample for testing.  Informed Suzy Bouchard, RN.
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
## 2017-02-07 NOTE — ED Notes (Signed)
Pt to xray
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
## 2017-02-07 NOTE — ED Notes (Signed)
Asked the pt if they were able to urinate.  They denied needing to at the moment, but acknowledged understanding that we will need to collect one for testing.  Informed Suzy Bouchard, RN.
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
## 2017-02-07 NOTE — ED Notes (Signed)
bacitracion and bandage applied to pt's L big toe
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
## 2017-02-10 LAB — CULTURE, BLOOD (ROUTINE X 2)
Culture: NO GROWTH
Culture: NO GROWTH
Special Requests: ADEQUATE
Special Requests: ADEQUATE

## 2017-02-12 LAB — CULTURE, BLOOD (ROUTINE X 2)
CULTURE: NO GROWTH
Culture: NO GROWTH
Special Requests: ADEQUATE
Special Requests: ADEQUATE
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
## 2017-03-20 NOTE — Congregational Nurse Program (Signed)
Congregational Nurse Program Note  Date of Encounter: 03/20/2017  Past Medical History: No past medical history on file.  Encounter Details:     CNP Questionnaire - 03/20/17 1000      Patient Demographics   Is this a new or existing patient? New   Patient is considered a/an Refugee   Race African     Patient Assistance   Location of Patient Assistance Not Applicable   Patient's financial/insurance status Medicaid   Uninsured Patient ([place] Card/Care Connects) No   Patient referred to apply for the following financial assistance Not Applicable   Food insecurities addressed Provided food supplies   Transportation assistance No   Assistance securing medications No   Educational health offerings Spiritual care;Interpersonal relationships     Encounter Details   Primary purpose of visit Other   Was an Emergency Department visit averted? Not Applicable   Does patient have a medical provider? No   Patient referred to Establish PCP;Other   Was a mental health screening completed? (GAINS tool) No   Does patient have dental issues? No   Does patient have vision issues? Yes   Was a vision referral made? Yes   Does your patient have an abnormal blood pressure today? No   Since previous encounter, have you referred patient for abnormal blood pressure that resulted in a new diagnosis or medication change? No   Does your patient have an abnormal blood glucose today? No   Since previous encounter, have you referred patient for abnormal blood glucose that resulted in a new diagnosis or medication change? No   Was there a life-saving intervention made? No     Office visit for this [name] man to follow-up medical appointment and family counseling referral. Reports spouse working in [place] during day hours and not present. Admits that alcohol and smoking a problem. Expressed concern about children and both parents work schedule overlapping into evening. Ages range from 656-18 school ages.   [facility]; 9:00 am. Refer children to agency summer camp if applicable.  Return 1 week to confirm PCP and schedule counseling at [facility]. Ferol LuzMarietta Oma Alpert, RN/CN
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
## 2017-04-11 ENCOUNTER — Emergency Department (HOSPITAL_COMMUNITY)
Admission: EM | Admit: 2017-04-11 | Discharge: 2017-04-11 | Disposition: A | Discharge status: Home / Self Care | Payer: Self-pay | Attending: Emergency Medicine | Admitting: Emergency Medicine

## 2017-04-11 ENCOUNTER — Emergency Department (HOSPITAL_COMMUNITY): Payer: Self-pay

## 2017-04-11 ENCOUNTER — Encounter (HOSPITAL_COMMUNITY): Payer: Self-pay | Admitting: *Deleted

## 2017-04-11 DIAGNOSIS — R197 Diarrhea, unspecified: Secondary | ICD-10-CM | POA: Insufficient documentation

## 2017-04-11 DIAGNOSIS — F17200 Nicotine dependence, unspecified, uncomplicated: Secondary | ICD-10-CM | POA: Insufficient documentation

## 2017-04-11 DIAGNOSIS — R112 Nausea with vomiting, unspecified: Secondary | ICD-10-CM

## 2017-04-11 LAB — BASIC METABOLIC PANEL
Anion gap: 11 (ref 5–15)
BUN: 6 mg/dL (ref 6–20)
CHLORIDE: 100 mmol/L — AB (ref 101–111)
CO2: 29 mmol/L (ref 22–32)
Calcium: 9.5 mg/dL (ref 8.9–10.3)
Creatinine, Ser: 0.89 mg/dL (ref 0.61–1.24)
GFR calc non Af Amer: >60 mL/min (ref >60)
Glucose, Bld: 95 mg/dL (ref 65–99)
POTASSIUM: 4.1 mmol/L (ref 3.5–5.1)
SODIUM: 140 mmol/L (ref 135–145)

## 2017-04-11 LAB — CBC
HCT: 46 % (ref 39.0–52.0)
HEMOGLOBIN: 15.4 g/dL (ref 13.0–17.0)
MCH: 29.1 pg (ref 26.0–34.0)
MCHC: 33.5 g/dL (ref 30.0–36.0)
MCV: 87 fL (ref 78.0–100.0)
PLATELETS: 259 10*3/uL (ref 150–400)
RBC: 5.29 MIL/uL (ref 4.22–5.81)
RDW: 12.7 % (ref 11.5–15.5)
WBC: 4.6 10*3/uL (ref 4.0–10.5)

## 2017-04-11 LAB — I-STAT TROPONIN, ED: TROPONIN I, POC: 0 ng/mL (ref 0.00–0.08)

## 2017-04-11 LAB — LIPASE, BLOOD: LIPASE: 28 U/L (ref 11–51)

## 2017-04-11 IMAGING — DX DG CHEST 2V
2 series · 2 of 2 positions shown · non-contrast
Comparison: [DATE] chest radiographs.

CLINICAL DATA: 50-year-old male with left chest pain for 3 weeks.
Weakness, emesis, diarrhea, abdominal pain. Smoker.

EXAM:
CHEST  2 VIEW

[chest pa]
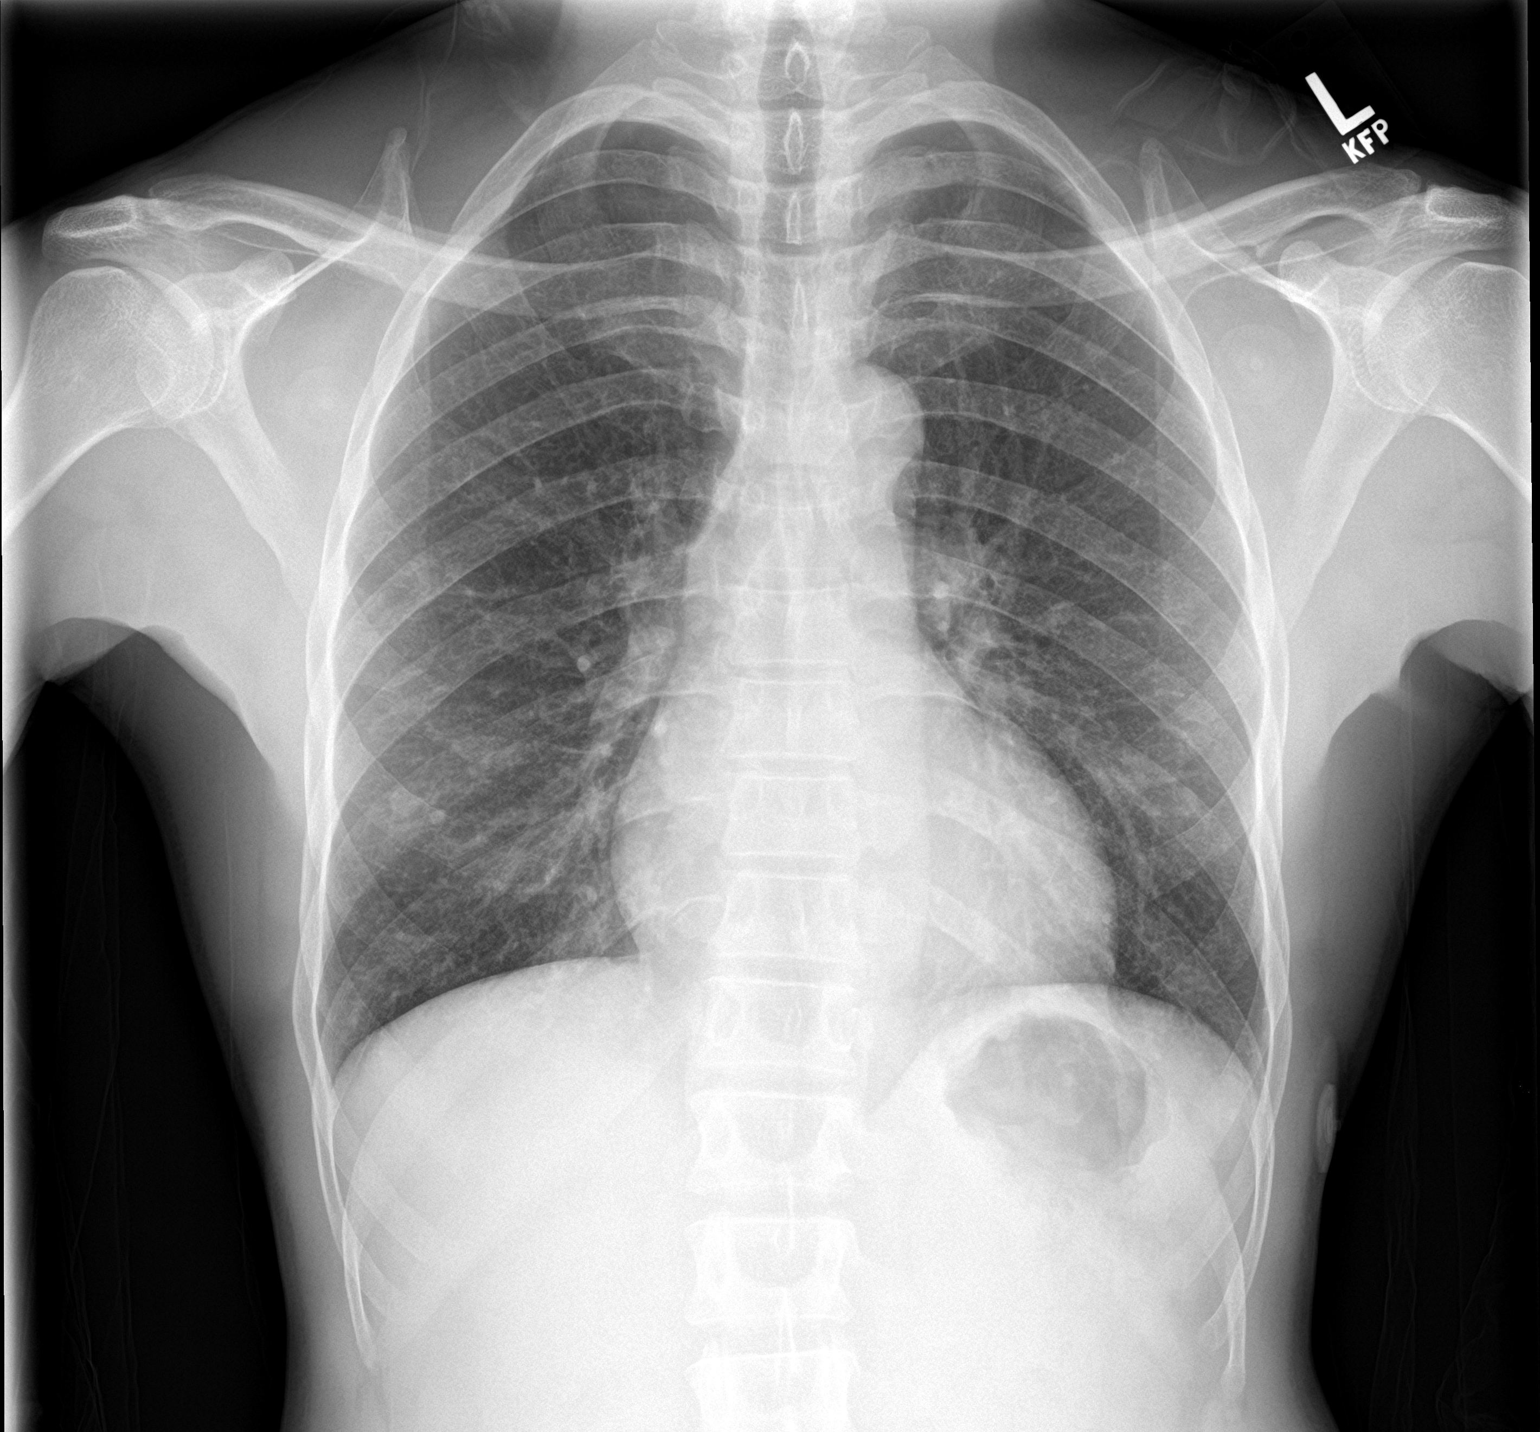

[chest lat]
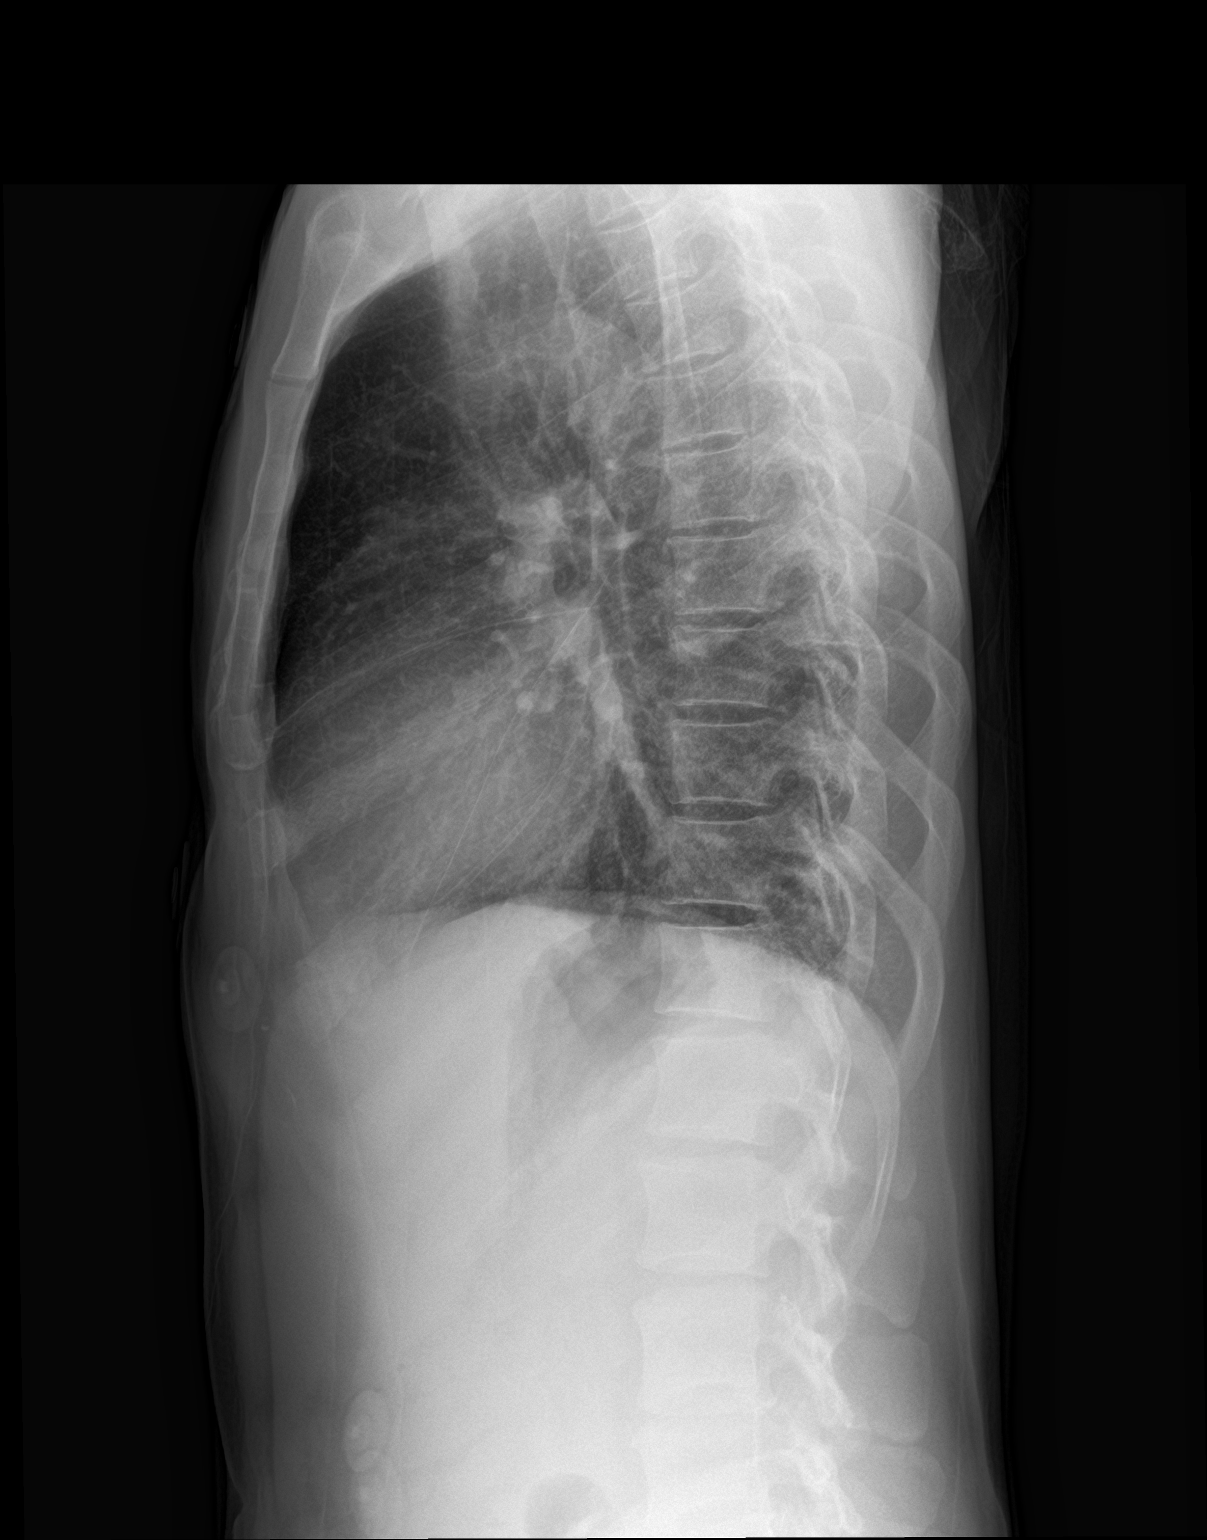

[2 of 2 positions shown; findings below may reference images not displayed]

FINDINGS: Borderline to mild cardiomegaly is stable. Other mediastinal
contours are within normal limits. Visualized tracheal air column is
within normal limits. No pneumothorax or pleural effusion. Stable
bilateral increased interstitial pulmonary markings, likely related
to smoking. No definite edema. No confluent pulmonary opacity,
bilateral nipple shadows re - demonstrated. Negative visible bowel
gas pattern. No acute osseous abnormality identified.
IMPRESSION: 1.  No acute cardiopulmonary abnormality.
2. Cardiomegaly and chronic appearing pulmonary interstitial changes
are stable.

## 2017-04-11 IMAGING — CT CT ABD-PELV W/ CM
2 of 5 series · 15 of 46 positions shown, 17 images · IV contrast (APPLIED)
Comparison: None.

CLINICAL DATA: 50-year-old male with lower abdominal pain, nausea
and vomiting for the past 2 days

EXAM:
CT ABDOMEN AND PELVIS WITH CONTRAST
TECHNIQUE: Multidetector CT imaging of the abdomen and pelvis was performed
using the standard protocol following bolus administration of
intravenous contrast.
CONTRAST:  <See Chart> [OE] IOPAMIDOL ([OE]) INJECTION
61%

[Series 3: abd/ pelvis 5.0 i30f 2 · axial · 0.54mm/px · z∈[+1113,+1453]mm · 12 of 78 slices shown, 14 images]
[im 5/78  soft-tissue]
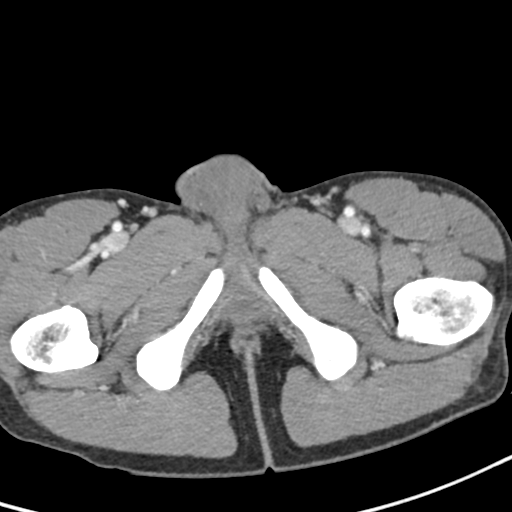
[im 5/78  bone]
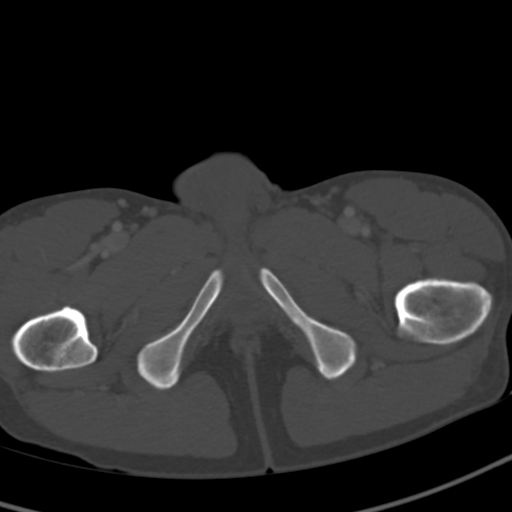
[im 10/78  soft-tissue]
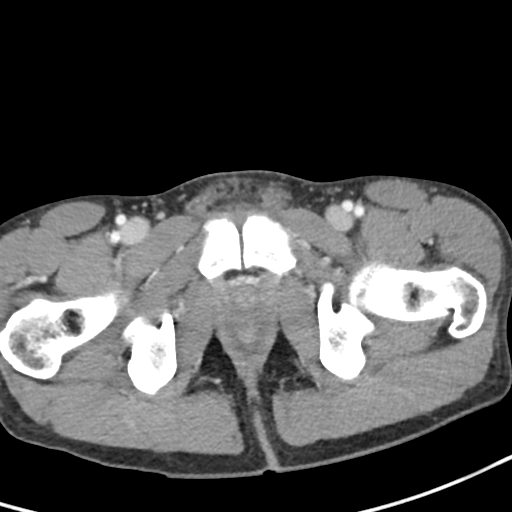
[im 20/78  soft-tissue]
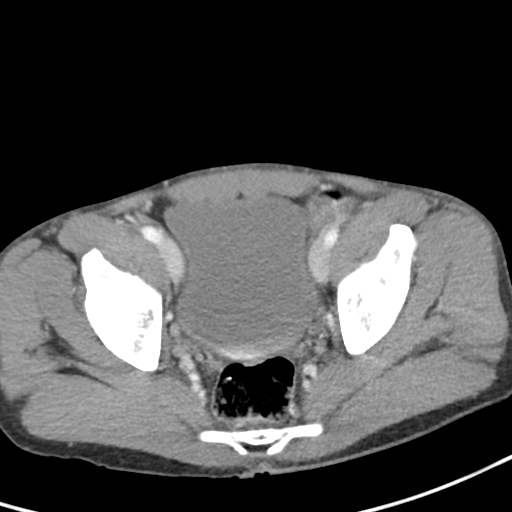
[im 25/78  soft-tissue]
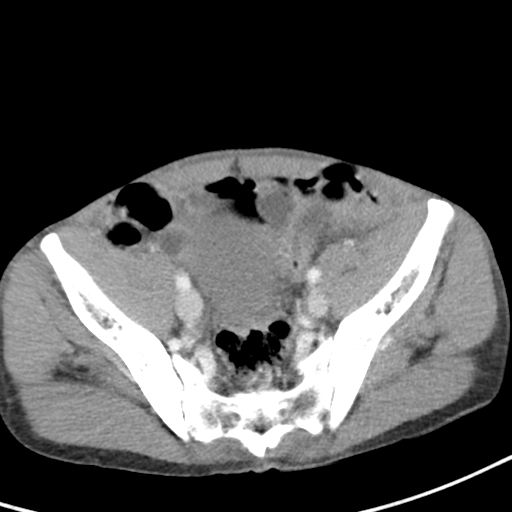
[im 29/78  soft-tissue]
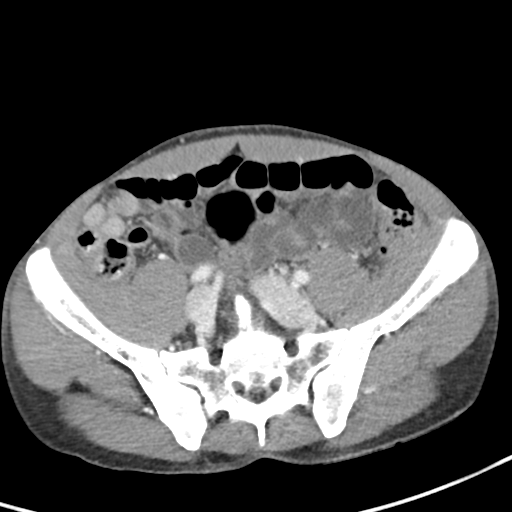
[im 34/78  soft-tissue]
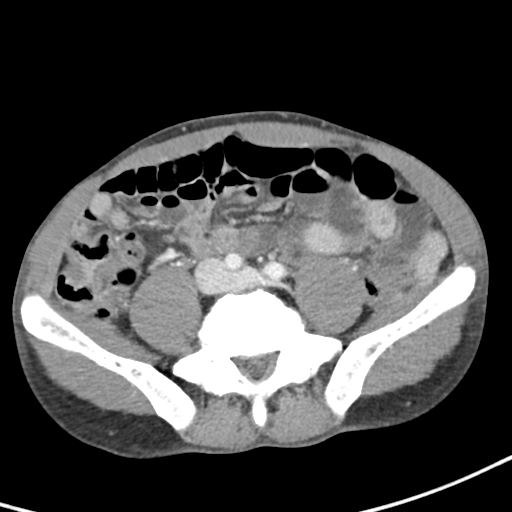
[im 44/78  soft-tissue]
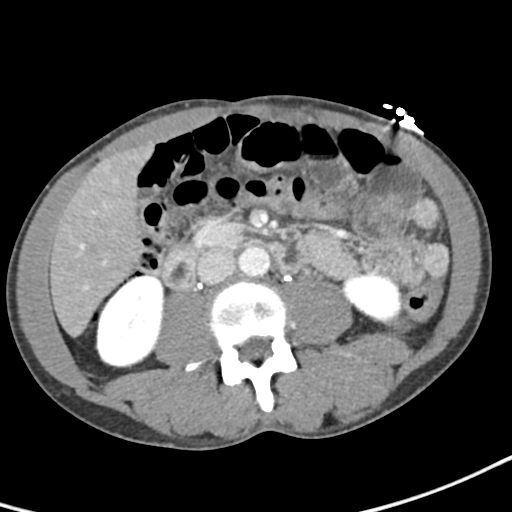
[im 49/78  soft-tissue]
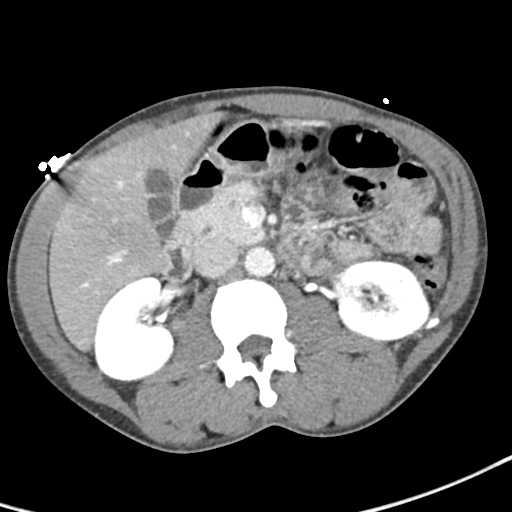
[im 53/78  soft-tissue]
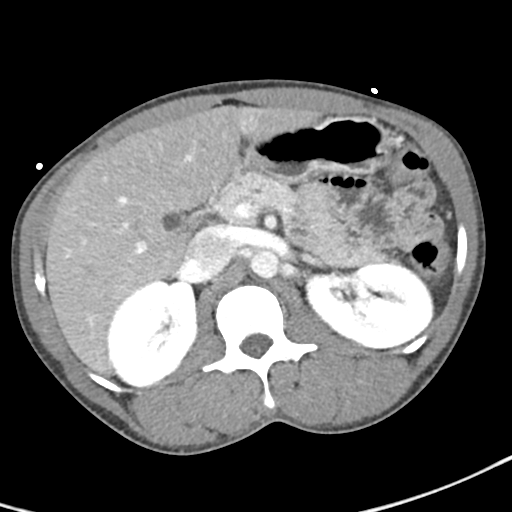
[im 53/78  bone]
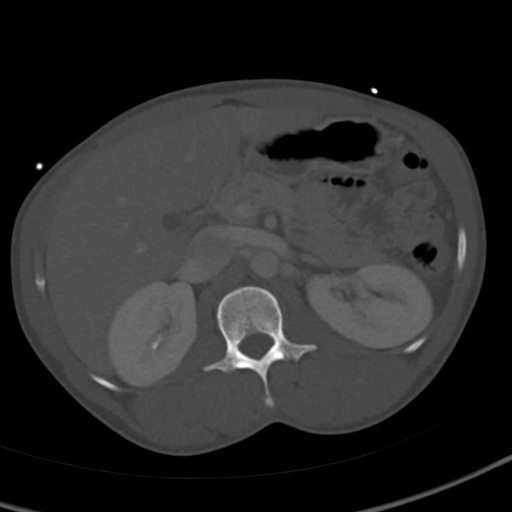
[im 58/78  soft-tissue]
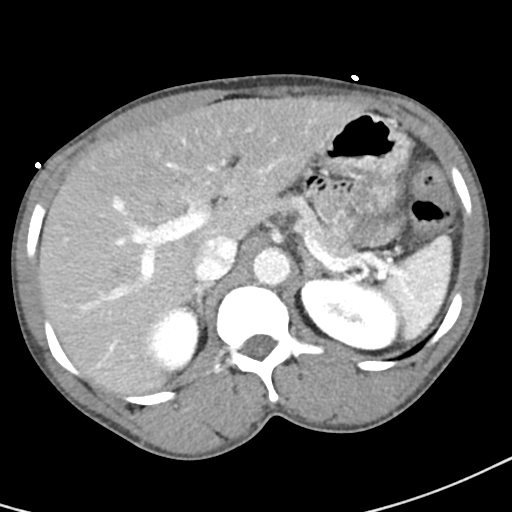
[im 68/78  soft-tissue]
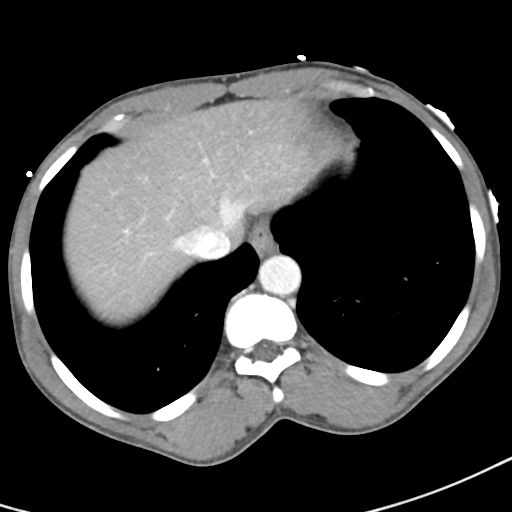
[im 73/78  soft-tissue]
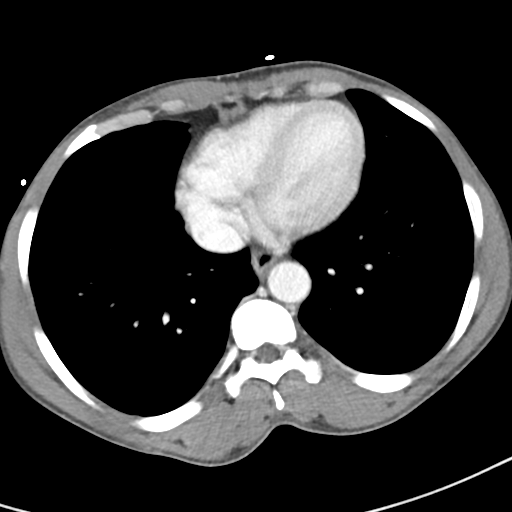

[Series 6: coronal soft tissue · coronal · 0.53mm/px · 3 of 66 slices shown]
[im 22/66  soft-tissue]
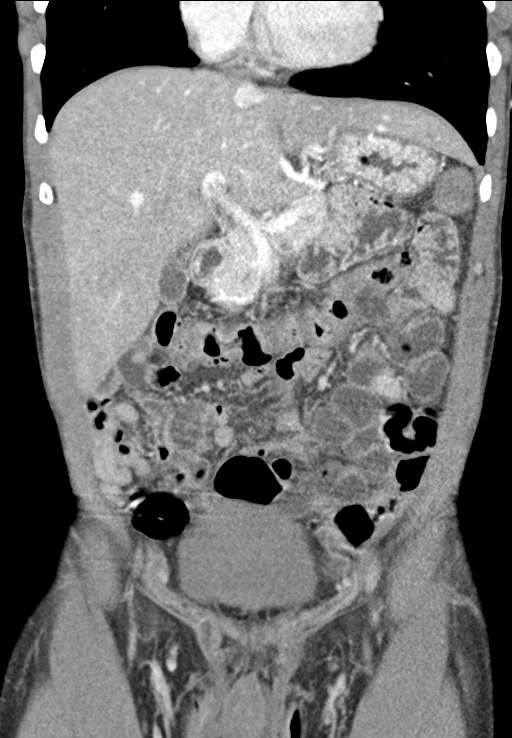
[im 29/66  soft-tissue]
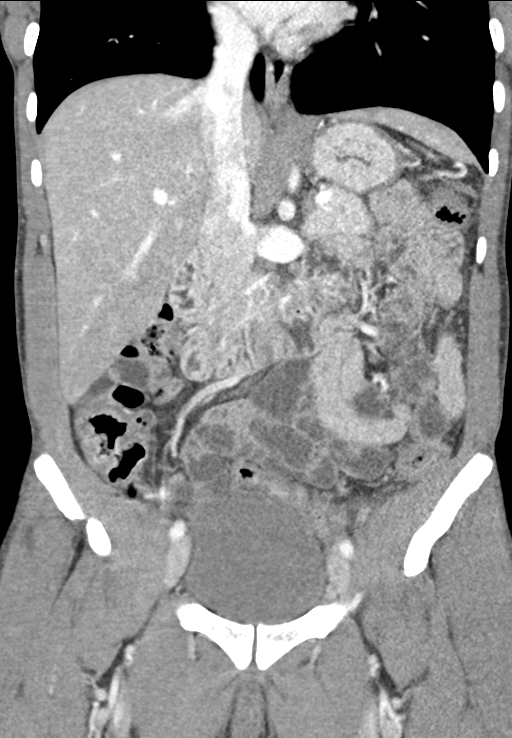
[im 37/66  soft-tissue]
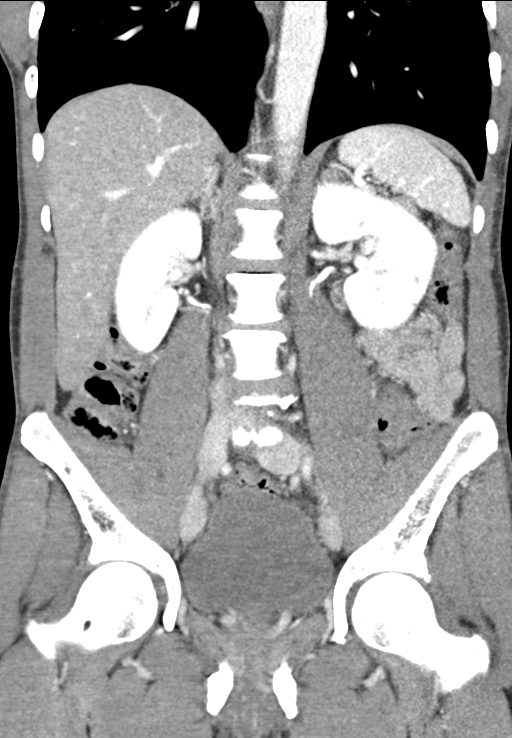

[15 of 46 positions shown; findings below may reference images not displayed]

FINDINGS: Lower chest: The lung bases are clear. Visualized cardiac structures
are within normal limits for size. No pericardial effusion.
Unremarkable visualized distal thoracic esophagus.

Hepatobiliary: Normal hepatic contour and morphology. No discrete
hepatic lesions. Normal appearance of the gallbladder. No intra or
extrahepatic biliary ductal dilatation.

Pancreas: Unremarkable. No pancreatic ductal dilatation or
surrounding inflammatory changes.

Spleen: Normal in size without focal abnormality.

Adrenals/Urinary Tract: Adrenal glands are unremarkable. Kidneys are
normal, without renal calculi, focal lesion, or hydronephrosis.
Bladder is unremarkable.

Stomach/Bowel: No focal bowel wall thickening or evidence of
obstruction. No free fluid or air. Normal appendix. However, there
is no inflammatory change in the right lower quadrant to suggest
appendicitis.

Vascular/Lymphatic: No significant vascular findings are present. No
enlarged abdominal or pelvic lymph nodes.

Reproductive: Prostate is unremarkable.

Other: No abdominal wall hernia or abnormality. No abdominopelvic
ascites.

Musculoskeletal: No acute or significant osseous findings.
Multilevel lumbar degenerative disc disease most significant at
L5-S1.
IMPRESSION: No acute abnormality in the abdomen or pelvis to explain the
patient's clinical symptoms.

## 2017-04-11 MED ORDER — ONDANSETRON HCL 4 MG PO TABS: 4.0000 mg | ORAL_TABLET | Freq: Three times a day (TID) | ORAL | 0 refills | Status: DC | PRN

## 2017-04-11 MED ORDER — SODIUM CHLORIDE 0.9 % IV BOLUS (SEPSIS)
1000.0000 mL | Freq: Once | INTRAVENOUS | Status: AC
  Administered 2017-04-11: 1000 mL via INTRAVENOUS

## 2017-04-11 MED ORDER — IOPAMIDOL (ISOVUE-300) INJECTION 61%
INTRAVENOUS | Status: AC
  Administered 2017-04-11: 100 mL
  Filled 2017-04-11: qty 100

## 2017-04-11 MED ORDER — GI COCKTAIL ~~LOC~~
30.0000 mL | Freq: Once | ORAL | Status: AC
  Administered 2017-04-11: 30 mL via ORAL
  Filled 2017-04-11: qty 30

## 2017-04-11 MED ORDER — ONDANSETRON HCL 4 MG/2ML IJ SOLN
4.0000 mg | Freq: Once | INTRAMUSCULAR | Status: AC
  Administered 2017-04-11: 4 mg via INTRAVENOUS
  Filled 2017-04-11: qty 2
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
## 2017-04-11 NOTE — Discharge Instructions (Signed)
Drink plenty of fluids and get plenty of rest. Make sure to eat small frequent meals of food that does not upset her stomach. He may use Zofran 20 minutes prior to eating for nausea and vomiting. Follow-up with primary care for reevaluation. Return to the ED if any concerning signs or symptoms develop.
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
## 2017-04-24 ENCOUNTER — Ambulatory Visit: Payer: Medicaid Other

## 2017-06-28 ENCOUNTER — Emergency Department (HOSPITAL_COMMUNITY)
Admission: EM | Admit: 2017-06-28 | Discharge: 2017-06-28 | Disposition: A | Discharge status: Home / Self Care | Payer: Self-pay | Attending: Emergency Medicine | Admitting: Emergency Medicine

## 2017-06-28 ENCOUNTER — Encounter (HOSPITAL_COMMUNITY): Payer: Self-pay | Admitting: *Deleted

## 2017-06-28 ENCOUNTER — Emergency Department (HOSPITAL_COMMUNITY): Payer: Self-pay

## 2017-06-28 DIAGNOSIS — Z79899 Other long term (current) drug therapy: Secondary | ICD-10-CM | POA: Insufficient documentation

## 2017-06-28 DIAGNOSIS — R42 Dizziness and giddiness: Secondary | ICD-10-CM | POA: Insufficient documentation

## 2017-06-28 DIAGNOSIS — F17200 Nicotine dependence, unspecified, uncomplicated: Secondary | ICD-10-CM | POA: Insufficient documentation

## 2017-06-28 DIAGNOSIS — I10 Essential (primary) hypertension: Secondary | ICD-10-CM | POA: Insufficient documentation

## 2017-06-28 DIAGNOSIS — F410 Panic disorder [episodic paroxysmal anxiety] without agoraphobia: Secondary | ICD-10-CM | POA: Insufficient documentation

## 2017-06-28 DIAGNOSIS — R531 Weakness: Secondary | ICD-10-CM | POA: Insufficient documentation

## 2017-06-28 LAB — URINALYSIS, ROUTINE W REFLEX MICROSCOPIC
BILIRUBIN URINE: NEGATIVE
GLUCOSE, UA: NEGATIVE mg/dL
HGB URINE DIPSTICK: NEGATIVE
Ketones, ur: NEGATIVE mg/dL
Leukocytes, UA: NEGATIVE
NITRITE: NEGATIVE
PH: 5 (ref 5.0–8.0)
Protein, ur: NEGATIVE mg/dL
SPECIFIC GRAVITY, URINE: 1.013 (ref 1.005–1.030)

## 2017-06-28 LAB — TROPONIN I: Troponin I: <0.03 ng/mL (ref <0.03)

## 2017-06-28 LAB — CBC
HCT: 41.6 % (ref 39.0–52.0)
Hemoglobin: 14.2 g/dL (ref 13.0–17.0)
MCH: 29.5 pg (ref 26.0–34.0)
MCHC: 34.1 g/dL (ref 30.0–36.0)
MCV: 86.3 fL (ref 78.0–100.0)
Platelets: 237 K/uL (ref 150–400)
RBC: 4.82 MIL/uL (ref 4.22–5.81)
RDW: 13.1 % (ref 11.5–15.5)
WBC: 4.6 K/uL (ref 4.0–10.5)

## 2017-06-28 LAB — I-STAT TROPONIN, ED: Troponin i, poc: 0.06 ng/mL (ref 0.00–0.08)

## 2017-06-28 LAB — BASIC METABOLIC PANEL WITH GFR
Anion gap: 12 (ref 5–15)
BUN: 9 mg/dL (ref 6–20)
CO2: 21 mmol/L — ABNORMAL LOW (ref 22–32)
Calcium: 8.6 mg/dL — ABNORMAL LOW (ref 8.9–10.3)
Chloride: 99 mmol/L — ABNORMAL LOW (ref 101–111)
Creatinine, Ser: 0.9 mg/dL (ref 0.61–1.24)
GFR calc Af Amer: >60 mL/min
GFR calc non Af Amer: >60 mL/min
Glucose, Bld: 92 mg/dL (ref 65–99)
Potassium: 4.3 mmol/L (ref 3.5–5.1)
Sodium: 132 mmol/L — ABNORMAL LOW (ref 135–145)

## 2017-06-28 IMAGING — CR DG CHEST 2V
2 series · 2 of 2 positions shown · non-contrast
Comparison: [DATE]

CLINICAL DATA: Shortness breath and weakness.

EXAM:
CHEST  2 VIEW

[chest pa]
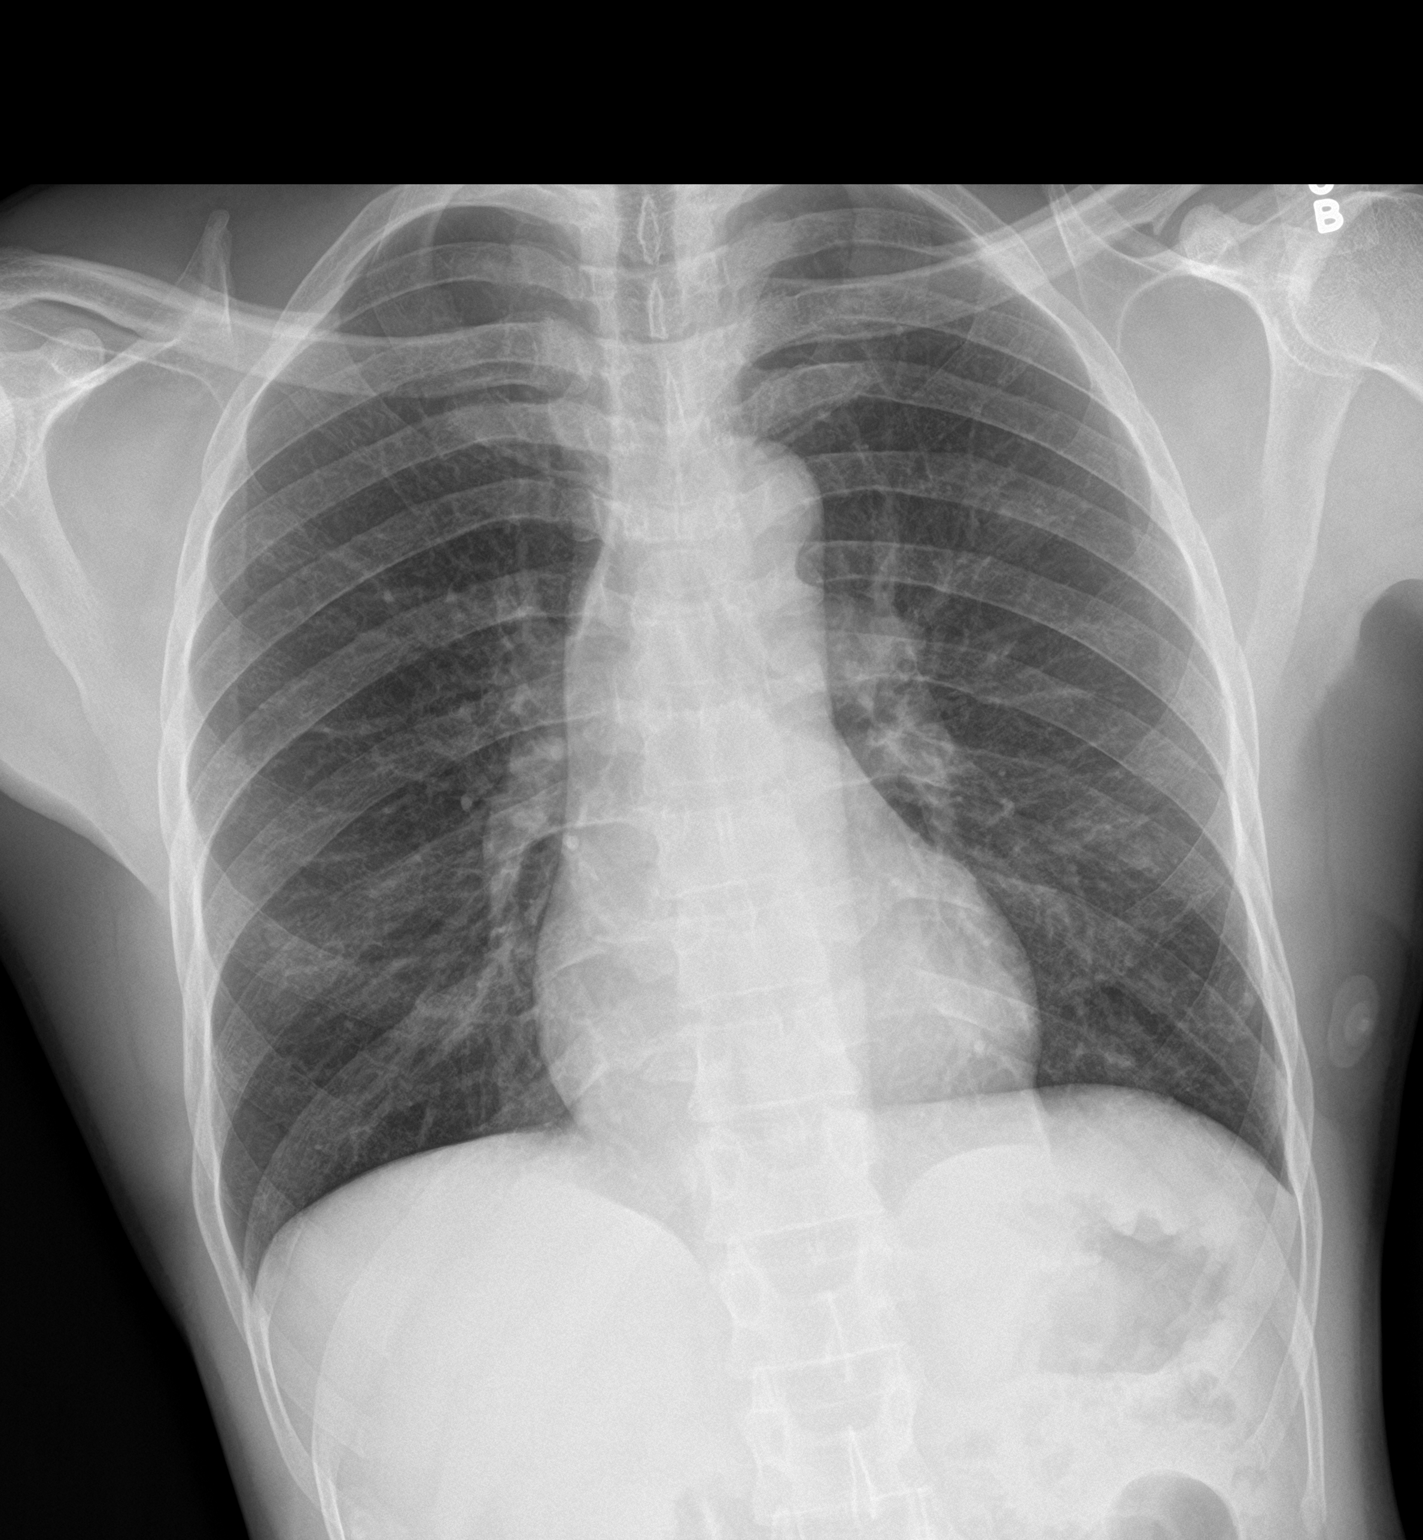

[chest lat]
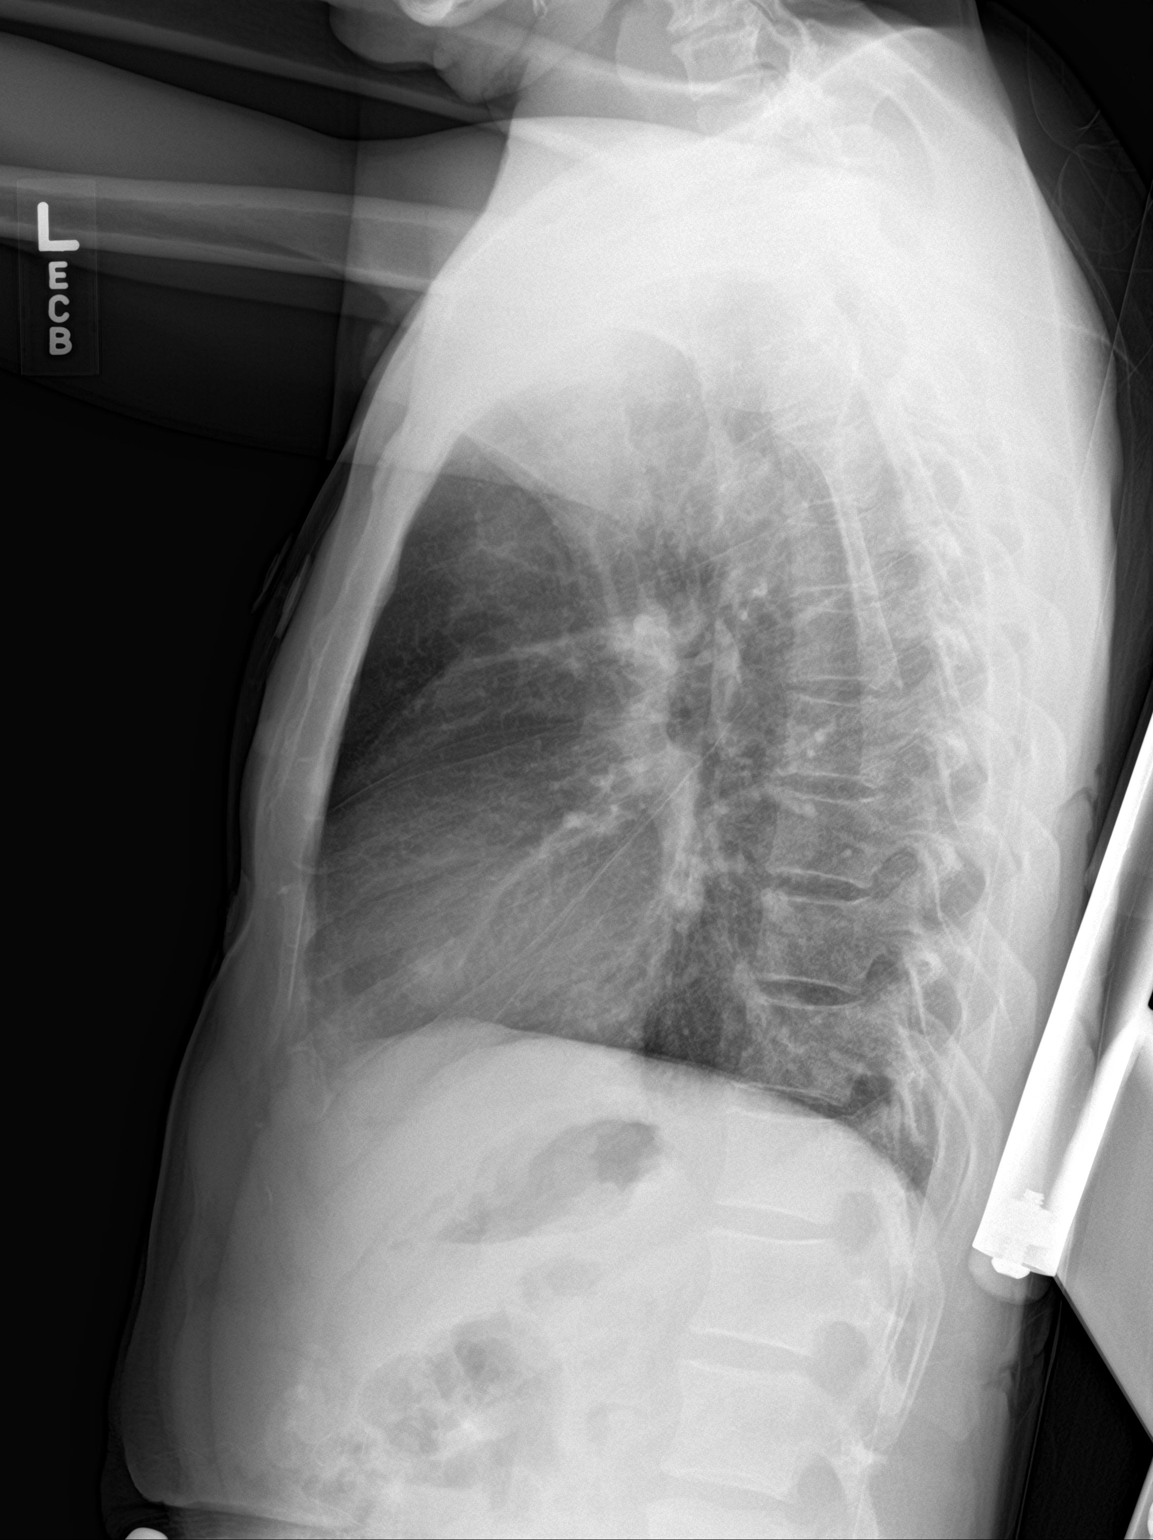

[2 of 2 positions shown; findings below may reference images not displayed]

FINDINGS: The heart size and mediastinal contours are within normal limits.
There is no focal infiltrate, pulmonary edema, or pleural effusions.
The visualized skeletal structures are unremarkable.
IMPRESSION: No active cardiopulmonary disease.

## 2017-06-28 MED ORDER — DIPHENHYDRAMINE HCL 50 MG/ML IJ SOLN
25.0000 mg | Freq: Once | INTRAMUSCULAR | Status: AC
  Administered 2017-06-28: 25 mg via INTRAVENOUS
  Filled 2017-06-28: qty 1

## 2017-06-28 MED ORDER — SODIUM CHLORIDE 0.9 % IV BOLUS (SEPSIS)
1000.0000 mL | Freq: Once | INTRAVENOUS | Status: AC
  Administered 2017-06-28: 1000 mL via INTRAVENOUS

## 2017-06-28 MED ORDER — LORAZEPAM 2 MG/ML IJ SOLN
1.0000 mg | Freq: Once | INTRAMUSCULAR | Status: AC
  Administered 2017-06-28: 1 mg via INTRAVENOUS
  Filled 2017-06-28: qty 1

## 2017-06-28 MED ORDER — PROCHLORPERAZINE EDISYLATE 5 MG/ML IJ SOLN
10.0000 mg | Freq: Once | INTRAMUSCULAR | Status: AC
  Administered 2017-06-28: 10 mg via INTRAVENOUS
  Filled 2017-06-28: qty 2

## 2017-06-28 MED ORDER — HYDROXYZINE HCL 25 MG PO TABS: 25.0000 mg | ORAL_TABLET | Freq: Four times a day (QID) | ORAL | 0 refills | Status: DC | PRN

## 2017-06-28 NOTE — ED Notes (Signed)
Attempted blood draw x2 unsuccessfully 
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
## 2017-06-28 NOTE — ED Triage Notes (Signed)
Pt is here with not feeling well , palpitations, dizzy, weakness, and chest pain.  Information obtained via phone interpretor (Swahili).

## 2017-06-28 NOTE — ED Notes (Signed)
Pt departed in NAD.  

## 2017-07-03 ENCOUNTER — Encounter (HOSPITAL_COMMUNITY): Payer: Self-pay

## 2017-07-03 ENCOUNTER — Other Ambulatory Visit: Payer: Self-pay

## 2017-07-03 ENCOUNTER — Emergency Department (HOSPITAL_COMMUNITY)
Admission: EM | Admit: 2017-07-03 | Discharge: 2017-07-03 | Discharge status: Against Medical Advice | Payer: Self-pay | Attending: Emergency Medicine | Admitting: Emergency Medicine

## 2017-07-03 ENCOUNTER — Other Ambulatory Visit (HOSPITAL_COMMUNITY): Payer: Self-pay

## 2017-07-03 DIAGNOSIS — Z5329 Procedure and treatment not carried out because of patient's decision for other reasons: Secondary | ICD-10-CM | POA: Insufficient documentation

## 2017-07-03 DIAGNOSIS — F17200 Nicotine dependence, unspecified, uncomplicated: Secondary | ICD-10-CM | POA: Insufficient documentation

## 2017-07-03 DIAGNOSIS — R1011 Right upper quadrant pain: Secondary | ICD-10-CM | POA: Insufficient documentation

## 2017-07-03 MED ORDER — SODIUM CHLORIDE 0.9 % IV BOLUS (SEPSIS): 1000.0000 mL | Freq: Once | INTRAVENOUS | Status: DC
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
## 2017-07-03 NOTE — ED Notes (Signed)
Multiple phone calls between pt and language line.  Called cab for pt to take him home.

## 2017-07-03 NOTE — ED Notes (Signed)
Pt expresses he needs to leave.  Translator line called, pt reports he has to go to work or he will be fired.  Dr. Renaldo ReelHuang in to speak with pt and advised pt will be leaving AMA and he needs to return or follow up with PCP.  MD gave pt information regarding [facility].
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
## 2017-07-04 ENCOUNTER — Encounter (HOSPITAL_COMMUNITY): Payer: Self-pay

## 2017-07-04 ENCOUNTER — Emergency Department (HOSPITAL_COMMUNITY)
Admission: EM | Admit: 2017-07-04 | Discharge: 2017-07-04 | Disposition: A | Discharge status: Home / Self Care | Payer: Self-pay | Attending: Emergency Medicine | Admitting: Emergency Medicine

## 2017-07-04 DIAGNOSIS — R4020 Unspecified coma: Secondary | ICD-10-CM

## 2017-07-04 DIAGNOSIS — S060X9A Concussion with loss of consciousness of unspecified duration, initial encounter: Secondary | ICD-10-CM | POA: Insufficient documentation

## 2017-07-04 DIAGNOSIS — Y999 Unspecified external cause status: Secondary | ICD-10-CM | POA: Insufficient documentation

## 2017-07-04 DIAGNOSIS — X58XXXA Exposure to other specified factors, initial encounter: Secondary | ICD-10-CM | POA: Insufficient documentation

## 2017-07-04 DIAGNOSIS — F17210 Nicotine dependence, cigarettes, uncomplicated: Secondary | ICD-10-CM | POA: Insufficient documentation

## 2017-07-04 DIAGNOSIS — Y9389 Activity, other specified: Secondary | ICD-10-CM | POA: Insufficient documentation

## 2017-07-04 DIAGNOSIS — Y929 Unspecified place or not applicable: Secondary | ICD-10-CM | POA: Insufficient documentation

## 2017-07-04 DIAGNOSIS — R55 Syncope and collapse: Secondary | ICD-10-CM | POA: Insufficient documentation

## 2017-07-04 LAB — BASIC METABOLIC PANEL WITH GFR
Anion gap: 5 (ref 5–15)
BUN: 5 mg/dL — ABNORMAL LOW (ref 6–20)
CO2: 25 mmol/L (ref 22–32)
Calcium: 8.9 mg/dL (ref 8.9–10.3)
Chloride: 104 mmol/L (ref 101–111)
Creatinine, Ser: 1.05 mg/dL (ref 0.61–1.24)
GFR calc Af Amer: >60 mL/min
GFR calc non Af Amer: >60 mL/min
Glucose, Bld: 121 mg/dL — ABNORMAL HIGH (ref 65–99)
Potassium: 3.9 mmol/L (ref 3.5–5.1)
Sodium: 134 mmol/L — ABNORMAL LOW (ref 135–145)

## 2017-07-04 LAB — CBC
HCT: 40.1 % (ref 39.0–52.0)
Hemoglobin: 13.1 g/dL (ref 13.0–17.0)
MCH: 29.2 pg (ref 26.0–34.0)
MCHC: 32.7 g/dL (ref 30.0–36.0)
MCV: 89.5 fL (ref 78.0–100.0)
Platelets: 208 10*3/uL (ref 150–400)
RBC: 4.48 MIL/uL (ref 4.22–5.81)
RDW: 13.5 % (ref 11.5–15.5)
WBC: 5.5 10*3/uL (ref 4.0–10.5)

## 2017-07-04 LAB — URINALYSIS, ROUTINE W REFLEX MICROSCOPIC
Bilirubin Urine: NEGATIVE
Glucose, UA: NEGATIVE mg/dL
Hgb urine dipstick: NEGATIVE
Ketones, ur: NEGATIVE mg/dL
Leukocytes, UA: NEGATIVE
Nitrite: NEGATIVE
Protein, ur: NEGATIVE mg/dL
Specific Gravity, Urine: 1.008 (ref 1.005–1.030)
pH: 6 (ref 5.0–8.0)

## 2017-07-04 LAB — CBG MONITORING, ED: Glucose-Capillary: 115 mg/dL — ABNORMAL HIGH (ref 65–99)

## 2017-07-04 MED ORDER — NAPROXEN 500 MG PO TABS: 500.0000 mg | ORAL_TABLET | Freq: Two times a day (BID) | ORAL | 0 refills | Status: DC

## 2017-07-04 NOTE — ED Triage Notes (Signed)
Per Pt and family, Pt was seen here yesterday for a witnessed seizure x 2 in the morning. Pt was sent home and family reports a second episode took place at 1800 last night. Pt has come back to be evaluated. Currently, pt is alert and oriented x4.

## 2017-07-04 NOTE — Discharge Instructions (Signed)
Please read and follow all provided instructions.  Your diagnoses today include:  1. Loss of consciousness (HCC)     Tests performed today include:  Blood counts and electrolytes  EKG  Vital signs. See below for your results today.   Medications prescribed:   Naproxen - anti-inflammatory pain medication  Do not exceed  naproxen every 12 hours, take with food  You have been prescribed an anti-inflammatory medication or NSAID. Take with food. Take smallest effective dose for the shortest duration needed for your pain. Stop taking if you experience stomach pain or vomiting.   Take any prescribed medications only as directed.  Home care instructions:  Follow any educational materials contained in this packet.  BE VERY CAREFUL not to take multiple medicines containing Tylenol (also called acetaminophen). Doing so can lead to an overdose which can damage your liver and cause liver failure and possibly death.   Follow-up instructions: Please follow-up with your primary care provider in the next 3 days for further evaluation of your symptoms.   Return instructions:   Please return to the Emergency Department if you experience worsening symptoms.   Return if you have additional episodes of passing out or losing consciousness.  Please return if you have any other emergent concerns.  Additional Information:  Your vital signs today were: BP 109/63 (BP Location: Right Arm)    Pulse 80    Temp 98.2 F (36.8 C) (Oral)    Resp 20    Ht  (1.626 m)    Wt 54.4 kg (120 lb)    SpO2 99%    BMI 20.60 kg/m  If your blood pressure (BP) was elevated above 135/85 this visit, please have this repeated by your doctor within one month. --------------
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
## 2017-07-18 ENCOUNTER — Emergency Department (HOSPITAL_COMMUNITY)
Admission: EM | Admit: 2017-07-18 | Discharge: 2017-07-18 | Disposition: A | Discharge status: Home / Self Care | Payer: Self-pay | Attending: Emergency Medicine | Admitting: Emergency Medicine

## 2017-07-18 ENCOUNTER — Encounter (HOSPITAL_COMMUNITY): Payer: Self-pay | Admitting: Emergency Medicine

## 2017-07-18 DIAGNOSIS — F17200 Nicotine dependence, unspecified, uncomplicated: Secondary | ICD-10-CM | POA: Insufficient documentation

## 2017-07-18 DIAGNOSIS — R5383 Other fatigue: Secondary | ICD-10-CM

## 2017-07-18 NOTE — ED Triage Notes (Signed)
Pt reports he was seen here on 9/12, states he was given medication that has him feeling weak all over. Pt reports he feels like he has no strength and is unable to work. Pt ambulatory to triage, no neuro deficits, a/ox4, resp e/u, nad.

## 2017-07-18 NOTE — Care Management (Signed)
ED CM noted patient patient to be active with the [facility] with Dr. Delrae Alfred. CM will send Clinic CM information to follow up with patient concerning follow up. No further ED CM needs identified.

## 2017-07-18 NOTE — Discharge Instructions (Signed)
Please read attached information. If you experience any new or worsening signs or symptoms please return to the emergency room for evaluation. Please follow-up with your primary care provider or specialist as discussed.  °

## 2017-07-18 NOTE — ED Notes (Signed)
Pt reports he feels fine now but when he takes his medication he feels weak. Pt also has a work note he wants filled out r/t HTN while at work. Pt states he needs a work note for one month so he can rest.
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
## 2017-08-28 NOTE — Congregational Nurse Program (Unsigned)
Congregational Nurse Program Note  Date of Encounter: 08/22/2017  Past Medical History: No past medical history on file.  Encounter Details: CNP Questionnaire - 08/28/17 0012      Questionnaire   Location Patient Served At  [facility]    Insurance  Not Applicable    Uninsured  Uninsured (Subsequent visits/quarter)    Food  Yes, have food insecurities    Interpersonal Safety  Yes, feel physically and emotionally safe where you currently live    Medication  Yes, have medication insecurities    Medical Provider  No    Referrals  Behavioral/Mental Health Provider;[facility]      Phone call to [facility] office requesting Flu shot. Described pain in right shoulder to upper back since about 24 hours. Relates pain to sleeping in wrong position. Denies coughing or extended chest pain. Normal chest sounds without rales or wheezing. Temp 99. B/P low.  No dizziness. Denies alcohol consumption now. Currently does not qualify for affordable health insurance and referred to SW for assistance. Currently followed at [facility] for alcohol problem at [address], [place]. [facility]. Experiencing financial problems. Currently stressed from accumulation of medical bills, despite request for assistance. Expects to return to original job after recovery. Describes family relations as "ok".  Reviewed with patient  need for adequate hydration and proper nutrition with condition. Advised to return for vital signs recheck and prn.                   Ferol LuzMarietta Errick Salts, RN/CN
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
## 2017-10-10 NOTE — Congregational Nurse Program (Unsigned)
Office visit to request influenza vaccine at [facility]. Complains of moderate soreness of muscle in right shoulder joint since 24 hours, related to sleep position. Able to extend and move arm  without intense pain. Continues outpatient visits for alcohol abuse at  [facility], [facility] [place]Encouraged continuation of appointments for therapy and follow-up with counselor in emergency.  Referred to [facility] social worker, C. Sin regarding mounting medical bills and options for medical insurance.  Flu vaccine administered. Information provided regarding side effects of vaccine.  Return prn. Ferol LuzMarietta Sharda Keddy, RN/CN

## 2017-10-18 ENCOUNTER — Emergency Department (HOSPITAL_COMMUNITY): Payer: Self-pay

## 2017-10-18 ENCOUNTER — Emergency Department (HOSPITAL_COMMUNITY)
Admission: EM | Admit: 2017-10-18 | Discharge: 2017-10-18 | Disposition: A | Discharge status: Home / Self Care | Payer: Self-pay | Attending: Emergency Medicine | Admitting: Emergency Medicine

## 2017-10-18 ENCOUNTER — Encounter (HOSPITAL_COMMUNITY): Payer: Self-pay | Admitting: Emergency Medicine

## 2017-10-18 DIAGNOSIS — W010XXA Fall on same level from slipping, tripping and stumbling without subsequent striking against object, initial encounter: Secondary | ICD-10-CM | POA: Insufficient documentation

## 2017-10-18 DIAGNOSIS — Y999 Unspecified external cause status: Secondary | ICD-10-CM | POA: Insufficient documentation

## 2017-10-18 DIAGNOSIS — F17210 Nicotine dependence, cigarettes, uncomplicated: Secondary | ICD-10-CM | POA: Insufficient documentation

## 2017-10-18 DIAGNOSIS — Y92009 Unspecified place in unspecified non-institutional (private) residence as the place of occurrence of the external cause: Secondary | ICD-10-CM | POA: Insufficient documentation

## 2017-10-18 DIAGNOSIS — W19XXXA Unspecified fall, initial encounter: Secondary | ICD-10-CM

## 2017-10-18 DIAGNOSIS — S2242XA Multiple fractures of ribs, left side, initial encounter for closed fracture: Secondary | ICD-10-CM | POA: Insufficient documentation

## 2017-10-18 DIAGNOSIS — Y939 Activity, unspecified: Secondary | ICD-10-CM | POA: Insufficient documentation

## 2017-10-18 DIAGNOSIS — Z79899 Other long term (current) drug therapy: Secondary | ICD-10-CM | POA: Insufficient documentation

## 2017-10-18 IMAGING — CR DG RIBS W/ CHEST 3+V*L*
3 series · 3 of 3 positions shown · non-contrast
Comparison: Chest radiograph dated [DATE]

CLINICAL DATA: 51-year-old male with fall and left chest wall/rib
pain.

EXAM:
LEFT RIBS AND CHEST - 3+ VIEW

[chest pa]
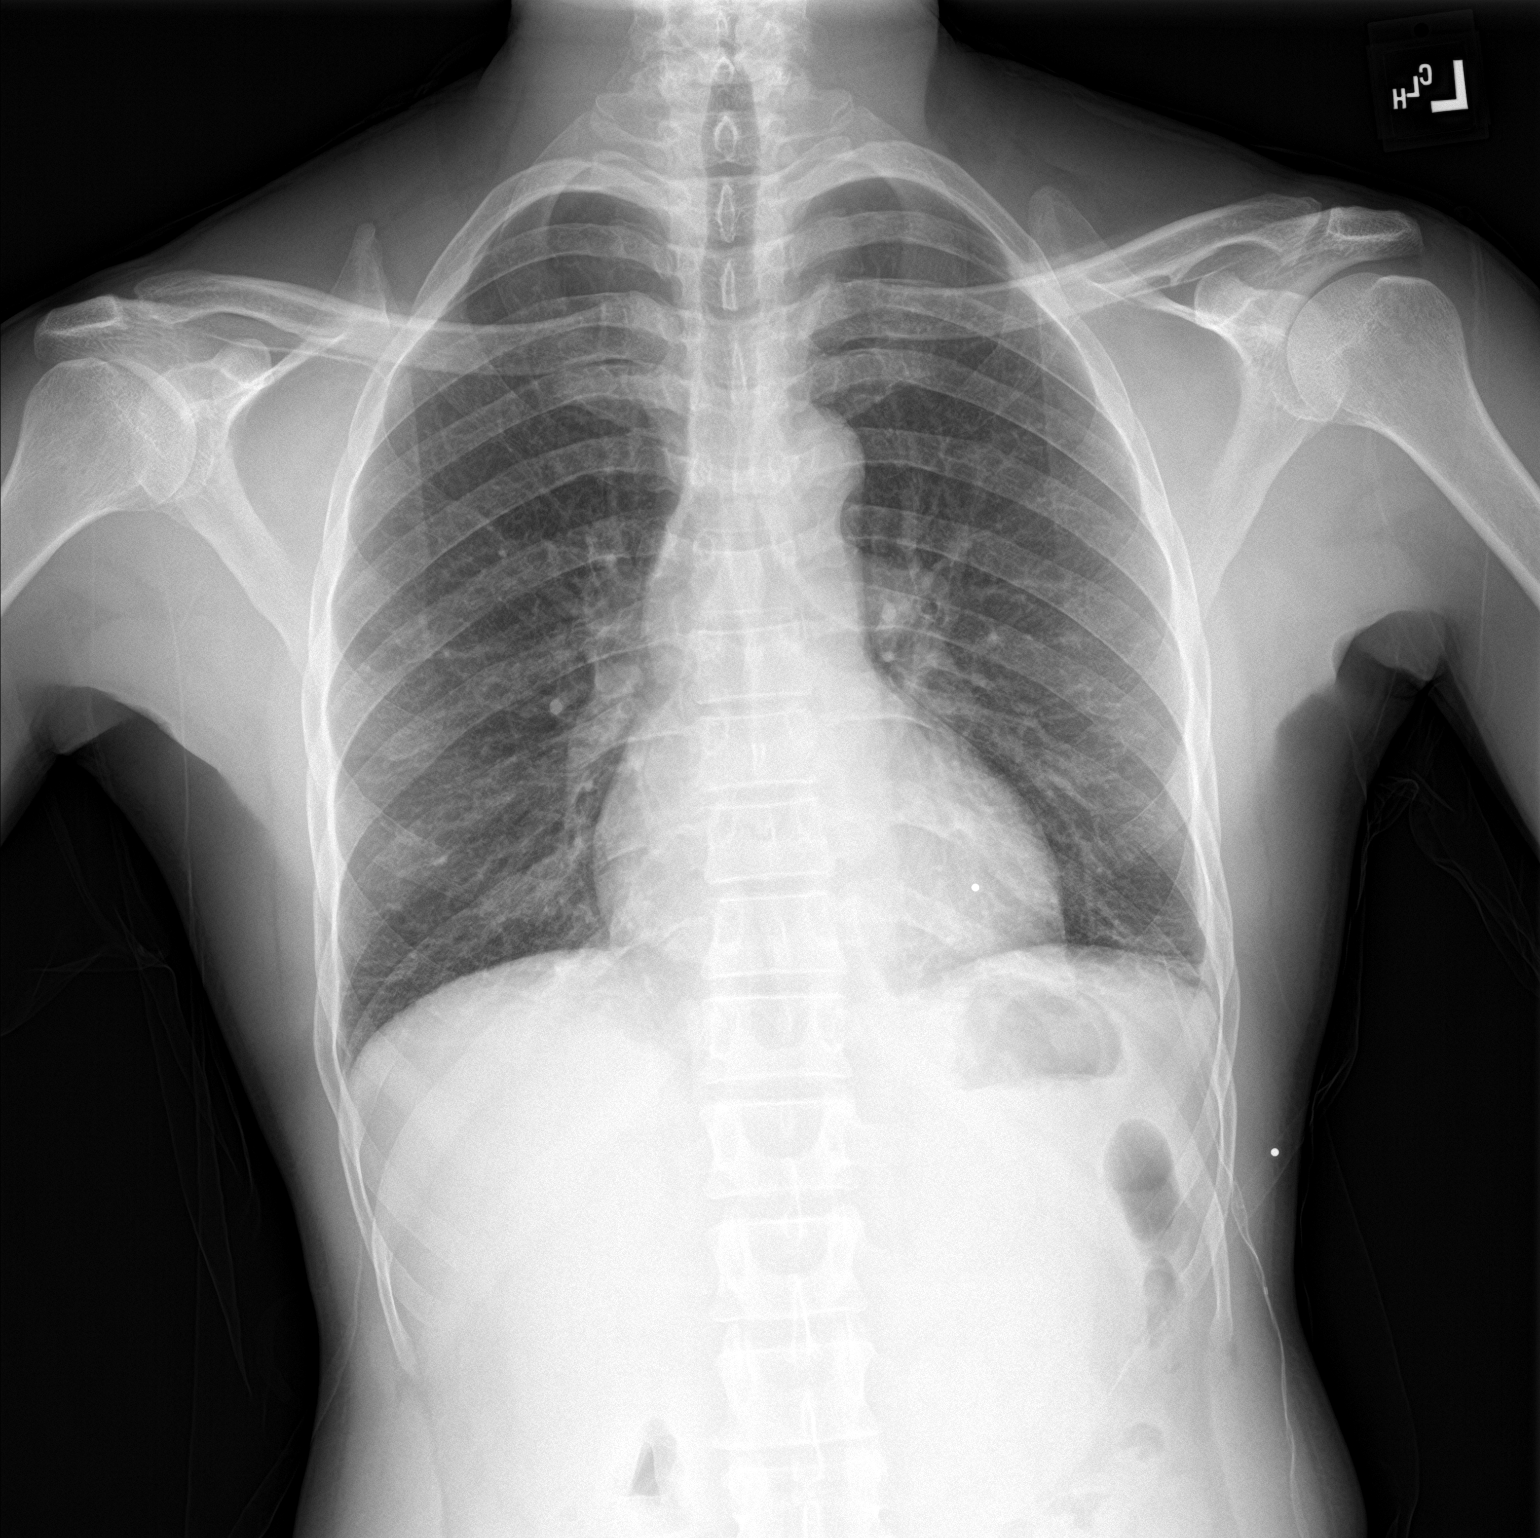

[rib pa obl]
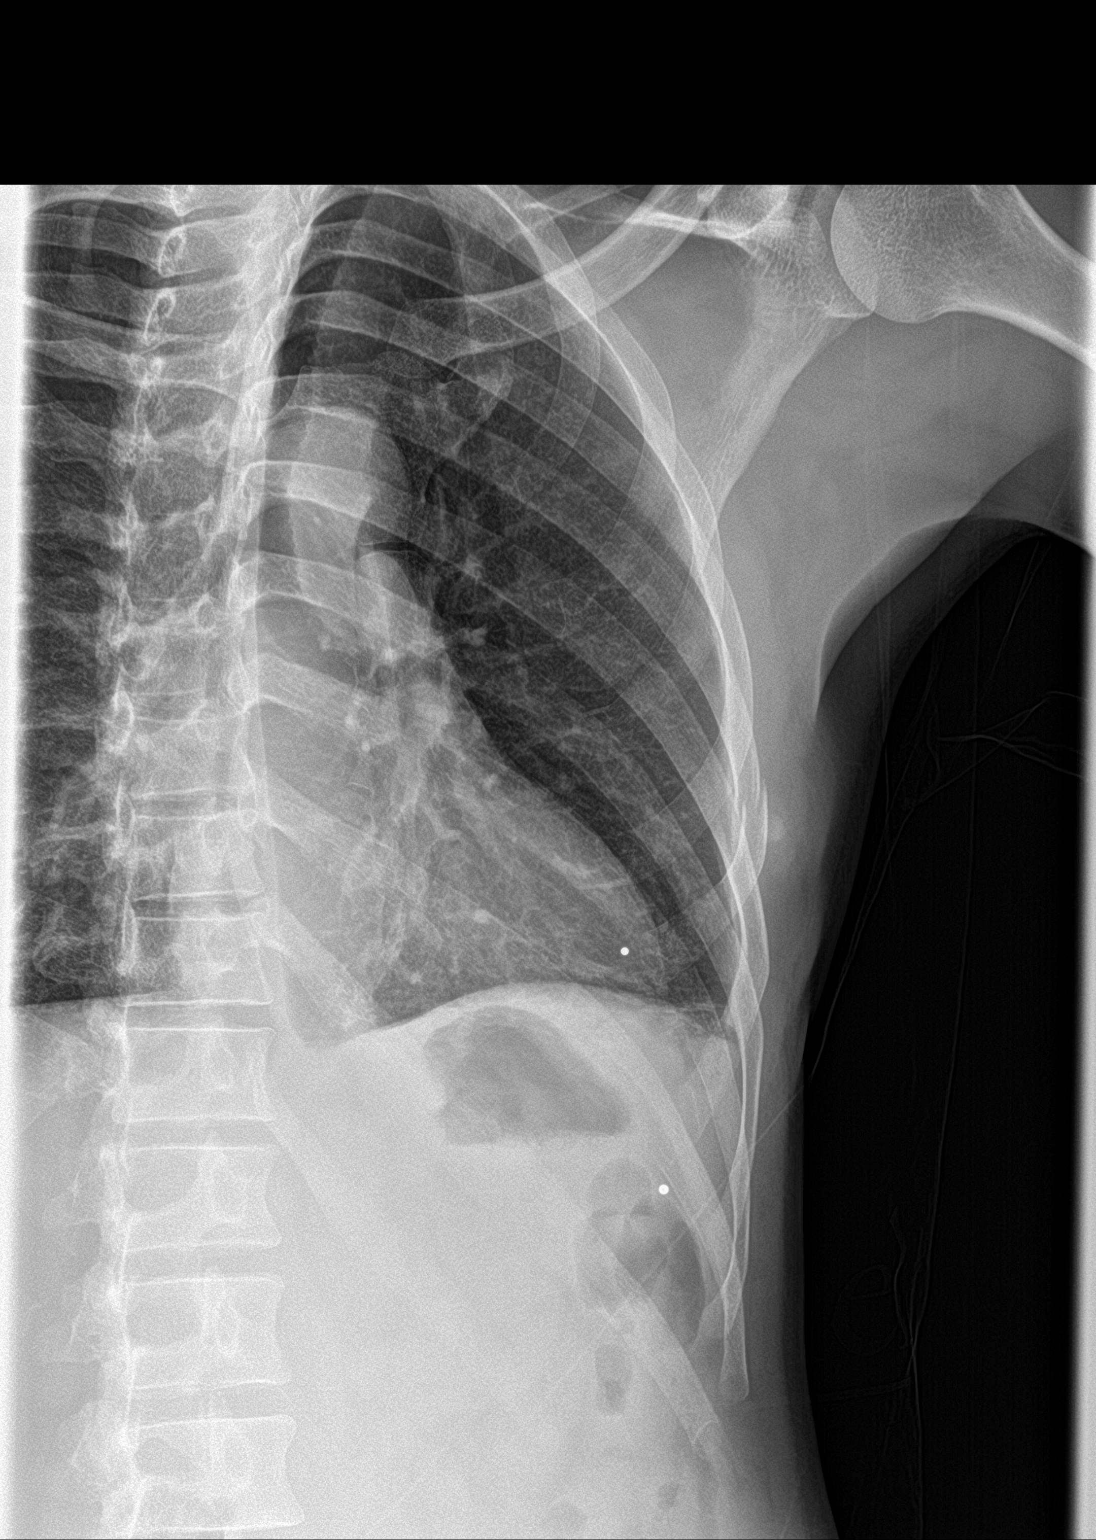

[rib pa]
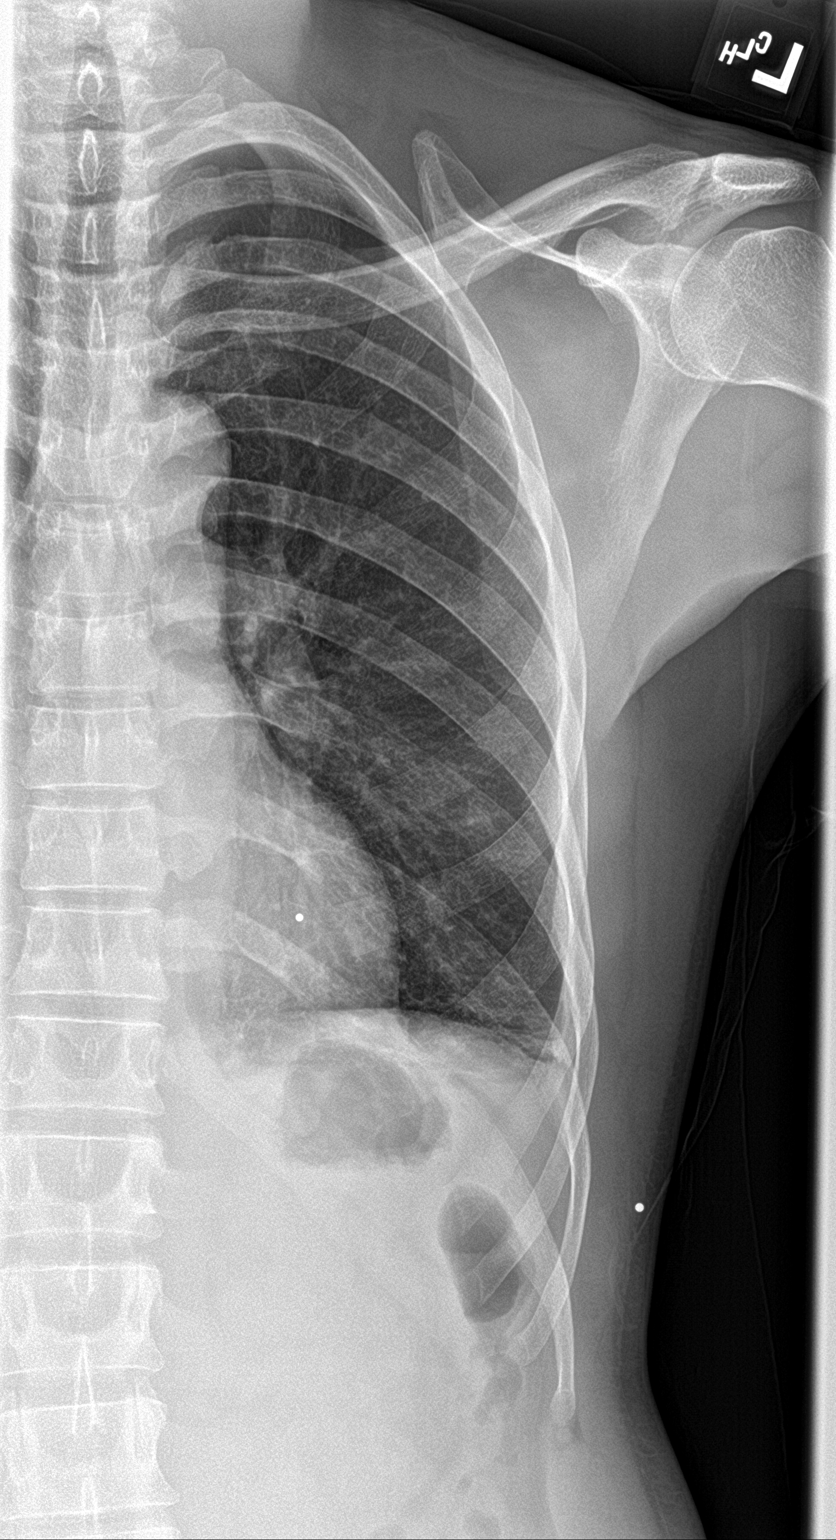

[3 of 3 positions shown; findings below may reference images not displayed]

FINDINGS: Minimal bibasilar atelectatic changes/ scarring. There is no focal
consolidation, pleural effusion, or pneumothorax. The cardiac
silhouette is within normal limits. Minimally displaced fracture of
the lateral left 6 and possibly seventh ribs. There is soft tissue
swelling of the left chest wall.
IMPRESSION: Left lateral rib fractures.  No pneumothorax.

## 2017-10-18 MED ORDER — IBUPROFEN 800 MG PO TABS: 800.0000 mg | ORAL_TABLET | Freq: Three times a day (TID) | ORAL | 0 refills | Status: DC

## 2017-10-18 MED ORDER — AZITHROMYCIN 250 MG PO TABS: 250.0000 mg | ORAL_TABLET | Freq: Every day | ORAL | 0 refills | Status: DC

## 2017-10-18 MED ORDER — OXYCODONE-ACETAMINOPHEN 5-325 MG PO TABS
1.0000 | ORAL_TABLET | Freq: Once | ORAL | Status: AC
  Administered 2017-10-18: 1 via ORAL
  Filled 2017-10-18: qty 1

## 2017-10-18 MED ORDER — HYDROCODONE-ACETAMINOPHEN 5-325 MG PO TABS: 1.0000 | ORAL_TABLET | Freq: Four times a day (QID) | ORAL | 0 refills | Status: DC | PRN

## 2017-10-18 NOTE — Discharge Instructions (Signed)
Take ibuprofen 3 times a day with meals.  Do not take other anti-inflammatories at the same time open (Advil, Motrin, naproxen, Aleve).  Take pain medication (norco) as needed for severe pain. Use ice packs to help control your pain. Take antibiotics as prescribed.  Take all of the antibiotics. Use the incentive spirometer (breathing machine) 10 times a day every day. Follow-up with your primary care doctor next week for further evaluation and management of your pain.  She can refill your pain medication and write another work note as needed.
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
## 2017-10-18 NOTE — ED Triage Notes (Signed)
Pt reports he tripped and fell Saturday, reports L sided pain, more so in rib area and entire L leg. Pt is ambulatory.

## 2017-10-18 NOTE — ED Notes (Signed)
Pt speaks Swahili

## 2018-04-30 NOTE — Congregational Nurse Program (Signed)
Congregational Nurse Program Note  Date of Encounter: 04/24/2018  Past Medical History: No past medical history on file.  Encounter Details: CNP Questionnaire - 04/24/18 1120      Questionnaire   Patient Status  Refugee    Race  African    Location Patient Served At  [employer] [facility]    Insurance  Not Applicable    Uninsured  Uninsured (Subsequent visits/quarter)    Food  Yes, have food insecurities    Housing/Utilities  Yes, have permanent housing    Transportation  No transportation needs    Interpersonal Safety  Yes, feel physically and emotionally safe where you currently live    Medication  Yes, have medication insecurities    Medical Provider  No    Referrals  Not Applicable    ED Visit Averted  Not Applicable    Life-Saving Intervention Made  Not Applicable       Brief visit and return for this man after several months absence from school. Previously referred for mental health support and family relationship issues. Denies alcohol abuse today.

## 2018-05-09 NOTE — Congregational Nurse Program (Signed)
Congregational Nurse Program Note  Date of Encounter: 04/24/2018  Past Medical History: No past medical history on file.  Encounter Details: CNP Questionnaire - 05/09/18 1015      Questionnaire   Patient Status  Refugee    Race  African    Location Patient Served At  [facility]    Insurance  Not Applicable    Uninsured  Uninsured (Subsequent visits/quarter)    Food  Yes, have food insecurities    Housing/Utilities  Yes, have permanent housing    Transportation  No transportation needs    Interpersonal Safety  Yes, feel physically and emotionally safe where you currently live    Medication  Yes, have medication insecurities    Medical Provider  No    Referrals  Behavioral/Mental Health Provider    ED Visit Averted  Not Applicable    Life-Saving Intervention Made  Not Applicable      Patient returned to [facility] nurse office after several month absence. Refuge from the [place]. Enrolled in school for English classes. Requesting blood pressure check today and complaining of nasal congestion and post nasal drainage with mild throat irritation. Blood pressure vitals normal. Throat slightly red without lesions. Concerned about decreasing appetite. Denies alcohol consumption.  Described concern about ongoing marital problems. Recently moved back in to home with spouse and children. Attempting counseling with minister and seeking other sources. No medical insurance coverage. Returned to work part time. Eligible for employers insurance in 2020. Referred to pharmacist for OTC antihistamines and vitamin. Stressed  increasing balanced diet daily. Recommended options for counseling. Will continue to explore options without insurance. Return prn. Ferol LuzMarietta Dinah Lupa, RN/CN

## 2018-05-09 NOTE — Congregational Nurse Program (Signed)
Congregational Nurse Program Note  Date of Encounter: 05/09/2018  Past Medical History: No past medical history on file.  Encounter Details: CNP Questionnaire - 05/09/18 1015      Questionnaire   Patient Status  Refugee    Race  African    Location Patient Served At  [employer]    Insurance  Not Applicable    Uninsured  Uninsured (Subsequent visits/quarter)    Food  Yes, have food insecurities    Housing/Utilities  Yes, have permanent housing    Transportation  No transportation needs    Interpersonal Safety  Yes, feel physically and emotionally safe where you currently live    Medication  Yes, have medication insecurities    Medical Provider  No    Referrals  Behavioral/Mental Health Provider    ED Visit Averted  Not Applicable    Life-Saving Intervention Made  Not Applicable       Follow-up visit to nurse office at [facility]. Blood pressure stable. Noticeable weight loss over last 30 days. Admits to poor appetite and feeling stressed. Describes "heart flutters" in attempt to explain feelings. Continued to express concern about family relations disrupted and especially with spouse. Lives with six children including one adult son. Married 30 years and feel that spouse is no longer interested in marriage. Denies use of alcohol or violence against family members. Employed part time and no health insurance. Ineligible for [employer]; explored already. Inquired about additional assistance and counseling through DSS, minister and [facility] staff. Recently returned to [employer] to attend  English classes daily. Obviously very stressed [name] man communicating effectively in [place].  Discussed the need to schedule appointment with counselor as acouple. Refer to Lattie Hawharlotte Evans, CHN/ Mental Health Nurse on May 14, 2018 on 723/19 at [facility] office.  Agreeable to recommendation.  Recommended foods and nutritional items to increase and maintain weight. Follow-up prn. Marcus LuzMarietta Tareka Jhaveri, RN/CHN

## 2018-05-09 NOTE — Congregational Nurse Program (Signed)
Congregational Nurse Program Note  Date of Encounter: 04/24/2018  Past Medical History: No past medical history on file.  Encounter Details: CNP Questionnaire - 05/09/18 1015      Questionnaire   Patient Status  Refugee    Race  African    Location Patient Served At  [employer] [facility]    Insurance  Not Applicable    Uninsured  Uninsured (Subsequent visits/quarter)    Food  Yes, have food insecurities    Housing/Utilities  Yes, have permanent housing    Transportation  No transportation needs    Interpersonal Safety  Yes, feel physically and emotionally safe where you currently live    Medication  Yes, have medication insecurities    Medical Provider  No    Referrals  Behavioral/Mental Health Provider    ED Visit Averted  Not Applicable    Life-Saving Intervention Made  Not Applicable

## 2018-05-09 NOTE — Congregational Nurse Program (Signed)
Congregational Nurse Program Note  Date of Encounter: 04/24/2018  Past Medical History: No past medical history on file.  Encounter Details: CNP Questionnaire - 05/09/18 1015      Questionnaire   Patient Status  Refugee    Race  African    Location [facility] Served At  [facility]    Insurance  Not Applicable    Uninsured  Uninsured (Subsequent visits/quarter)    Food  Yes, have food insecurities    Housing/Utilities  Yes, have permanent housing    Transportation  No transportation needs    Interpersonal Safety  Yes, feel physically and emotionally safe where you currently live    Medication  Yes, have medication insecurities    Medical Provider  No    Referrals  Behavioral/Mental Health Provider    ED Visit Averted  Not Applicable    Life-Saving Intervention Made  Not Applicable        

## 2018-05-15 DIAGNOSIS — F1020 Alcohol dependence, uncomplicated: Secondary | ICD-10-CM

## 2018-05-22 DIAGNOSIS — F1020 Alcohol dependence, uncomplicated: Secondary | ICD-10-CM | POA: Insufficient documentation

## 2018-05-22 NOTE — Congregational Nurse Program (Signed)
Congregational Nurse Program Note  Date of Encounter: 05/15/2018  Past Medical History: No past medical history on file.  Encounter Details: CNP Questionnaire - 05/15/18 1130      Questionnaire   Patient Status  Refugee    Race  African    Location Patient Served At  [facility]    Insurance  Not Applicable    Uninsured  Uninsured (Subsequent visits/quarter)    Food  Yes, have food insecurities    Housing/Utilities  Yes, have permanent housing    Transportation  No transportation needs    Interpersonal Safety  Yes, feel physically and emotionally safe where you currently live    Medication  Yes, have medication insecurities    Medical Provider  No    Referrals  Behavioral/Mental Health Provider    ED Visit Averted  Not Applicable    Life-Saving Intervention Made  Not Applicable      Office visit at [facility] follow-up and "no show" for counseling with Lattie Hawharlotte Evans, MH nurse on 7/23. Reports family emergency and mental stress from friend's death, reason for absence. Unable to comply with hours of [facility] ([facility]) due to work hours and  approximately 2 hours drive away. Denies recent use of alcohol or illegal substances . Agrees that counseling needed for self and family. Weight loss a concern. Appetite described as "ok". Intake of foods high in protein, more veggies and fruits stressed. Increase water intake. [facility] discussion with Logan Bores. Evans. No insurance coverage. Agreed to seek counseling for self and family. Details of agency and contact information provided. Ferol LuzMarietta Mikaili Flippin, RN/CN.

## 2018-11-21 ENCOUNTER — Emergency Department (HOSPITAL_COMMUNITY)
Admission: EM | Admit: 2018-11-21 | Discharge: 2018-11-21 | Disposition: A | Discharge status: Home / Self Care | Payer: BLUE CROSS/BLUE SHIELD | Attending: Emergency Medicine | Admitting: Emergency Medicine

## 2018-11-21 ENCOUNTER — Emergency Department (HOSPITAL_COMMUNITY): Payer: BLUE CROSS/BLUE SHIELD

## 2018-11-21 DIAGNOSIS — M791 Myalgia, unspecified site: Secondary | ICD-10-CM | POA: Diagnosis present

## 2018-11-21 DIAGNOSIS — F17210 Nicotine dependence, cigarettes, uncomplicated: Secondary | ICD-10-CM | POA: Insufficient documentation

## 2018-11-21 DIAGNOSIS — R531 Weakness: Secondary | ICD-10-CM

## 2018-11-21 DIAGNOSIS — Z79899 Other long term (current) drug therapy: Secondary | ICD-10-CM | POA: Insufficient documentation

## 2018-11-21 DIAGNOSIS — R05 Cough: Secondary | ICD-10-CM | POA: Insufficient documentation

## 2018-11-21 DIAGNOSIS — R51 Headache: Secondary | ICD-10-CM | POA: Diagnosis not present

## 2018-11-21 DIAGNOSIS — R0602 Shortness of breath: Secondary | ICD-10-CM | POA: Insufficient documentation

## 2018-11-21 LAB — URINALYSIS, ROUTINE W REFLEX MICROSCOPIC
BILIRUBIN URINE: NEGATIVE
Glucose, UA: NEGATIVE mg/dL
Hgb urine dipstick: NEGATIVE
KETONES UR: NEGATIVE mg/dL
Leukocytes, UA: NEGATIVE
NITRITE: NEGATIVE
PROTEIN: NEGATIVE mg/dL
Specific Gravity, Urine: 1.001 — ABNORMAL LOW (ref 1.005–1.030)
pH: 7 (ref 5.0–8.0)

## 2018-11-21 LAB — CBC
HCT: 42.8 % (ref 39.0–52.0)
Hemoglobin: 15.1 g/dL (ref 13.0–17.0)
MCH: 29.8 pg (ref 26.0–34.0)
MCHC: 35.3 g/dL (ref 30.0–36.0)
MCV: 84.4 fL (ref 80.0–100.0)
PLATELETS: 133 10*3/uL — AB (ref 150–400)
RBC: 5.07 MIL/uL (ref 4.22–5.81)
RDW: 14.1 % (ref 11.5–15.5)
WBC: 6.5 10*3/uL (ref 4.0–10.5)
nRBC: 0 % (ref 0.0–0.2)

## 2018-11-21 LAB — BASIC METABOLIC PANEL
Anion gap: 8 (ref 5–15)
CO2: 26 mmol/L (ref 22–32)
CREATININE: 0.97 mg/dL (ref 0.61–1.24)
Calcium: 9.3 mg/dL (ref 8.9–10.3)
Chloride: 97 mmol/L — ABNORMAL LOW (ref 98–111)
GFR calc non Af Amer: >60 mL/min (ref >60)
Glucose, Bld: 110 mg/dL — ABNORMAL HIGH (ref 70–99)
Potassium: 4.4 mmol/L (ref 3.5–5.1)
SODIUM: 131 mmol/L — AB (ref 135–145)

## 2018-11-21 IMAGING — CT CT HEAD W/O CM
4 series · 16 of 47 positions shown, 18 images · non-contrast
Comparison: Prior CT from [DATE]

CLINICAL DATA: Initial evaluation for acute headache.

EXAM:
CT HEAD WITHOUT CONTRAST
TECHNIQUE: Contiguous axial images were obtained from the base of the skull
through the vertex without intravenous contrast.

[Series 3: head wo · axial · 0.43mm/px · z∈[-75,+45]mm · 7 of 34 slices shown, 9 images]
[im 5/34  brain]
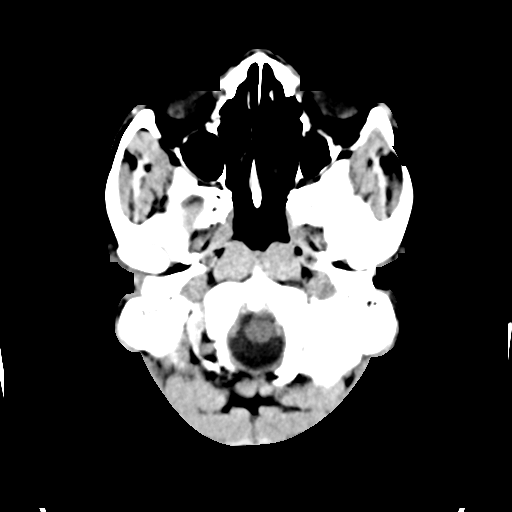
[im 5/34  bone]
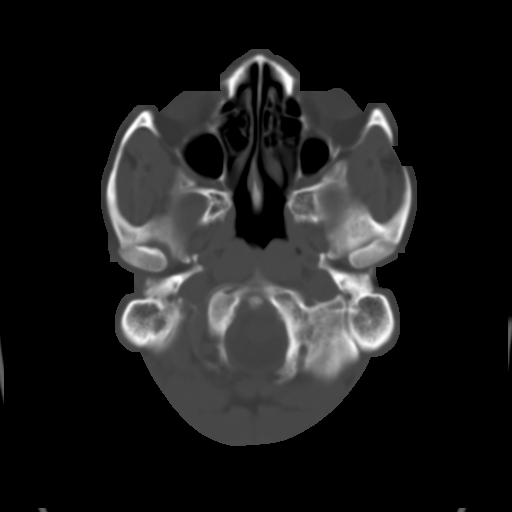
[im 9/34  brain]
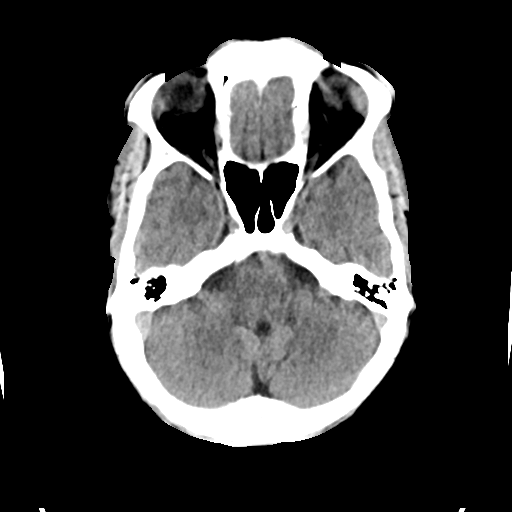
[im 13/34  brain]
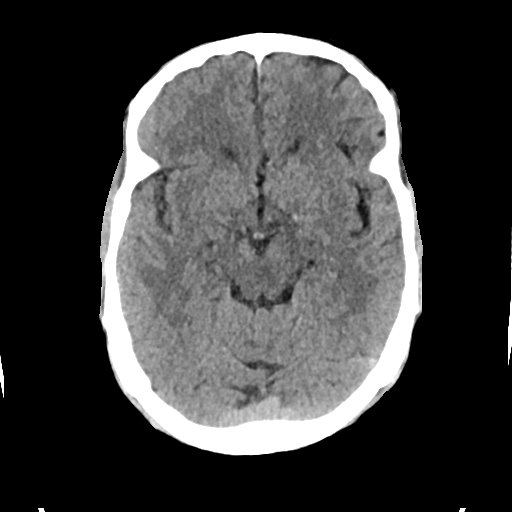
[im 17/34  brain]
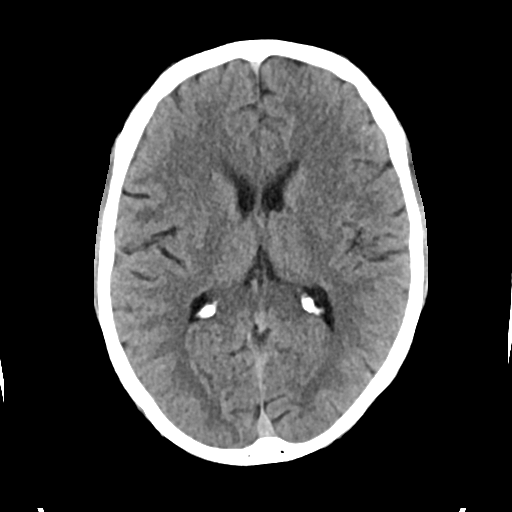
[im 21/34  brain]
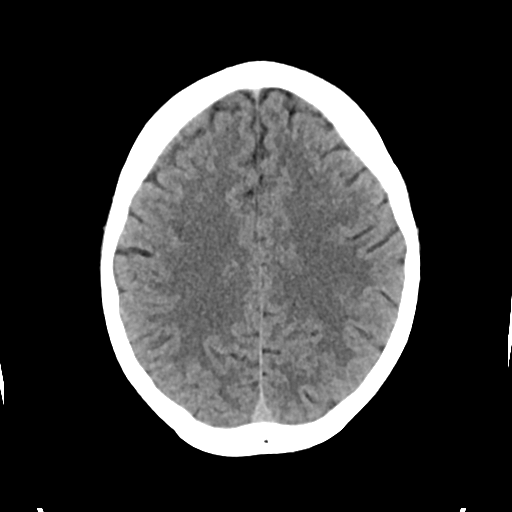
[im 21/34  bone]
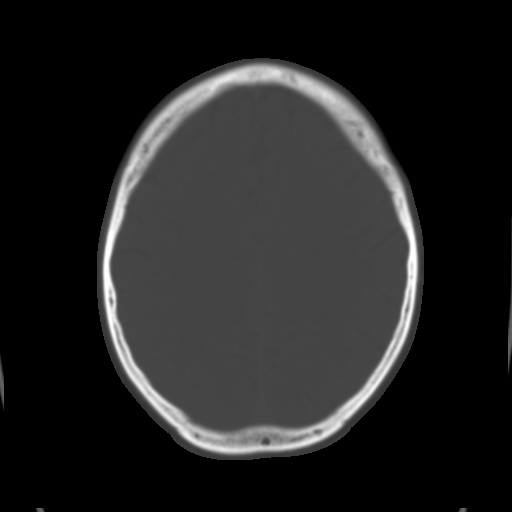
[im 25/34  brain]
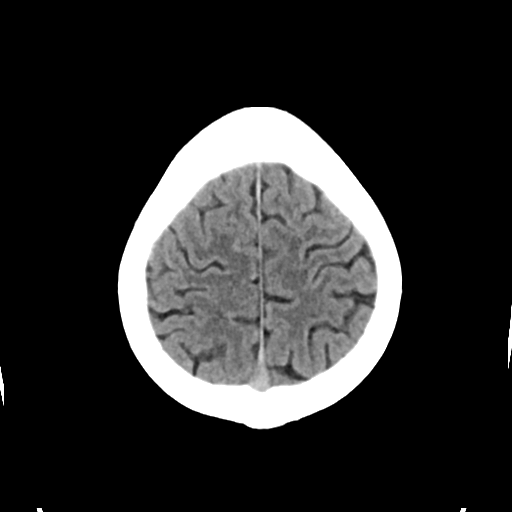
[im 29/34  brain]
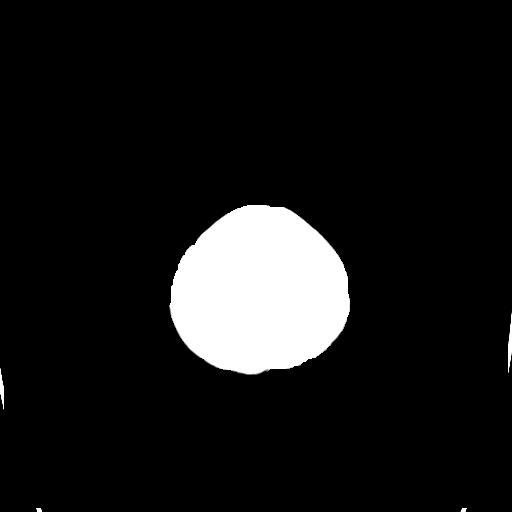

[Series 4: head bone · axial · 0.43mm/px · z∈[-79,-47]mm · 3 of 84 slices shown]
[im 9/84  bone]
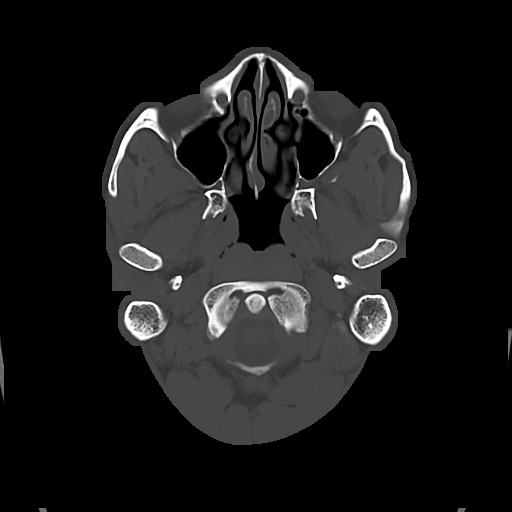
[im 17/84  bone]
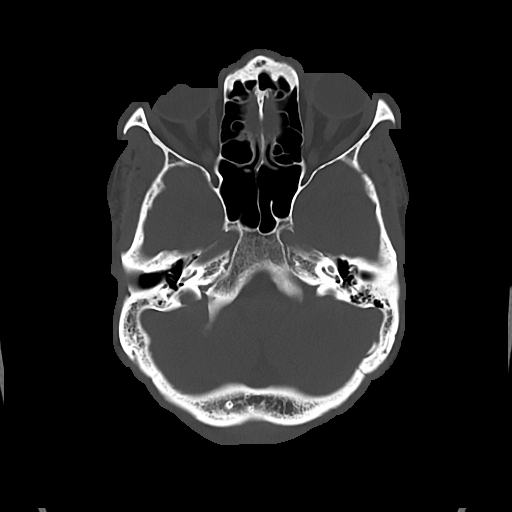
[im 25/84  bone]
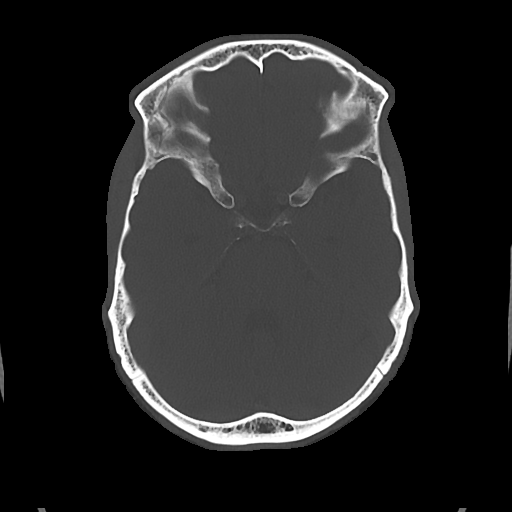

[Series 5: cor soft · coronal · 0.32mm/px · 3 of 74 slices shown]
[im 25/74  brain]
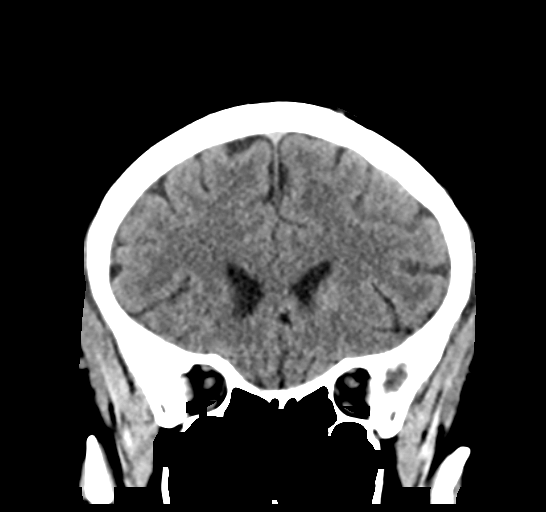
[im 33/74  brain]
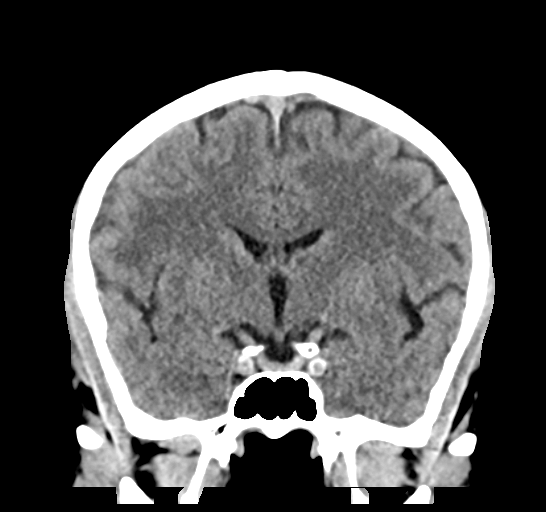
[im 41/74  brain]
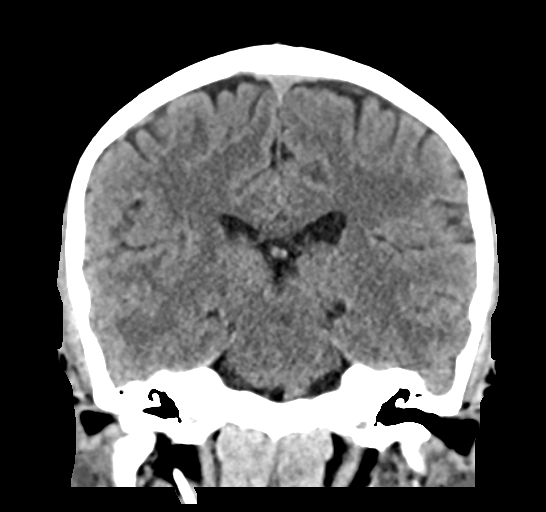

[Series 6: sag soft · sagittal · 0.32mm/px · 3 of 67 slices shown]
[im 23/67  brain]
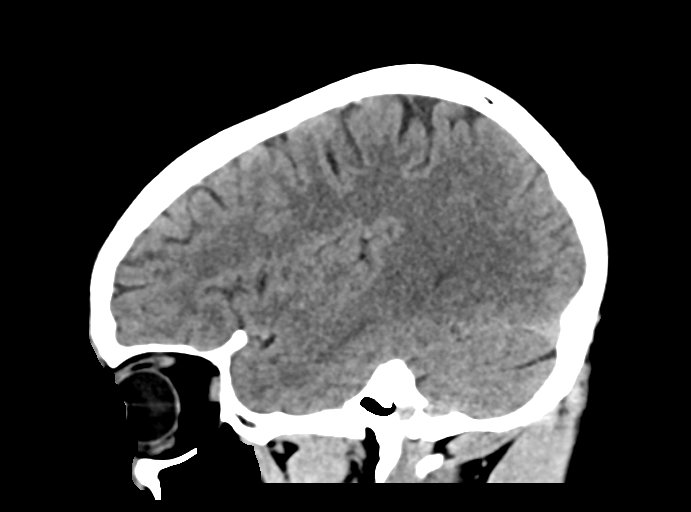
[im 34/67  brain]
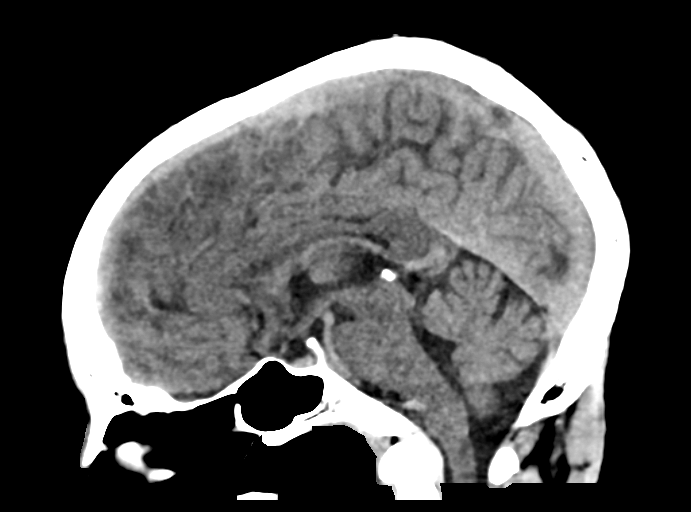
[im 45/67  brain]
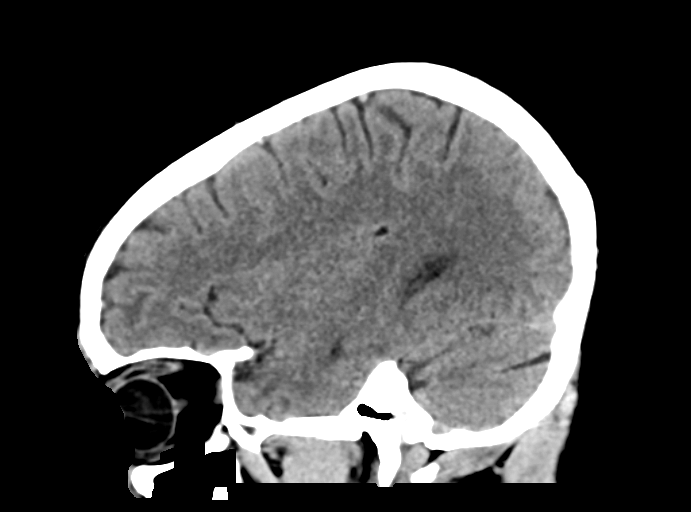

[16 of 47 positions shown; findings below may reference images not displayed]

FINDINGS: Brain: Cerebral volume within normal limits for patient age.

No evidence for acute intracranial hemorrhage. No findings to
suggest acute large vessel territory infarct. No mass lesion,
midline shift, or mass effect. Ventricles are normal in size without
evidence for hydrocephalus. No extra-axial fluid collection
identified.

Vascular: No hyperdense vessel identified.

Skull: Scalp soft tissues demonstrate no acute abnormality.
Calvarium intact.

Sinuses/Orbits: Globes and orbital soft tissues within normal
limits.

Visualized paranasal sinuses are clear. No mastoid effusion.
IMPRESSION: Normal head CT.  No acute intracranial abnormality.

## 2018-11-21 IMAGING — CR DG CHEST 2V
2 series · 2 of 2 positions shown · non-contrast
Comparison: [DATE]

CLINICAL DATA: Generalized weakness

EXAM:
CHEST - 2 VIEW

[chest pa]
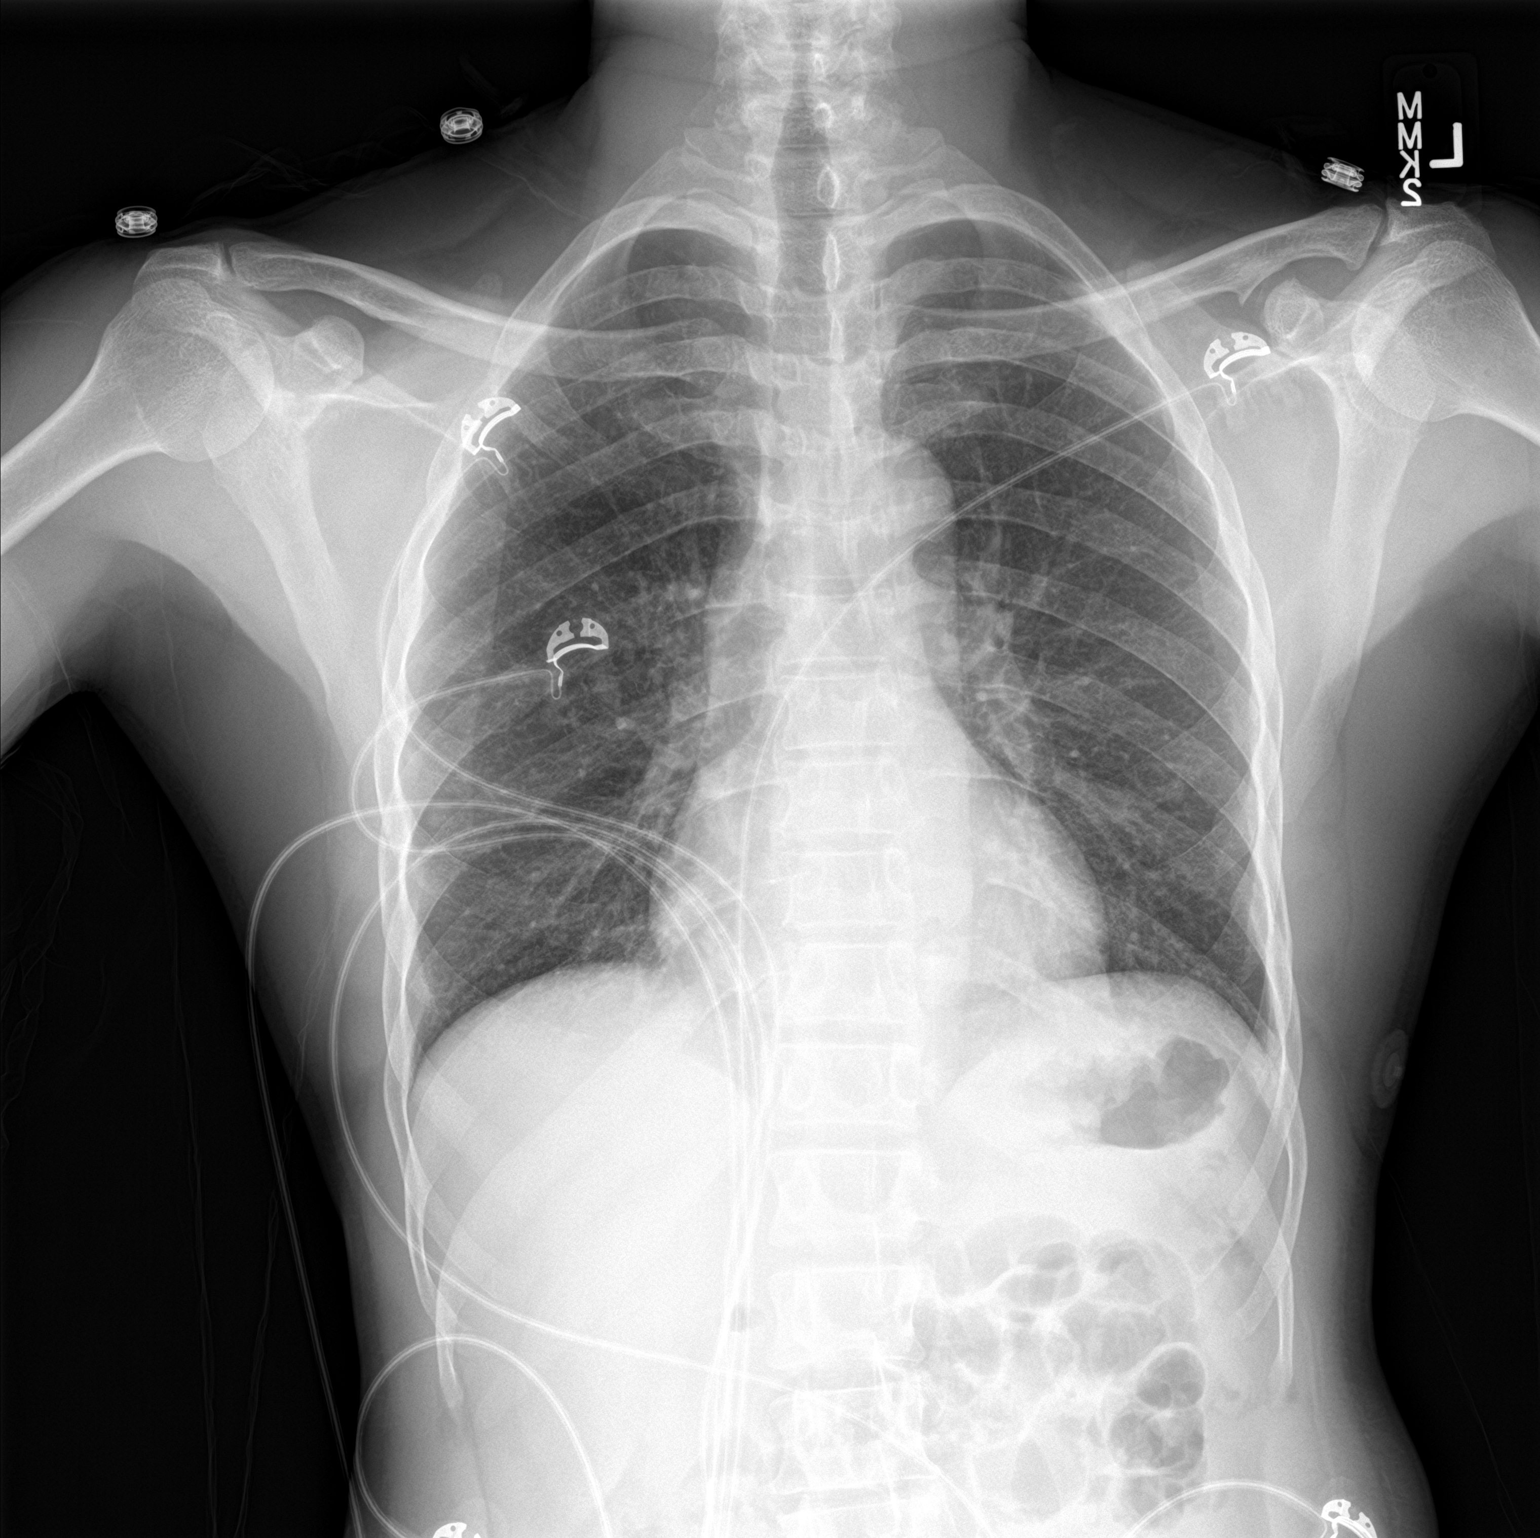

[chest lat]
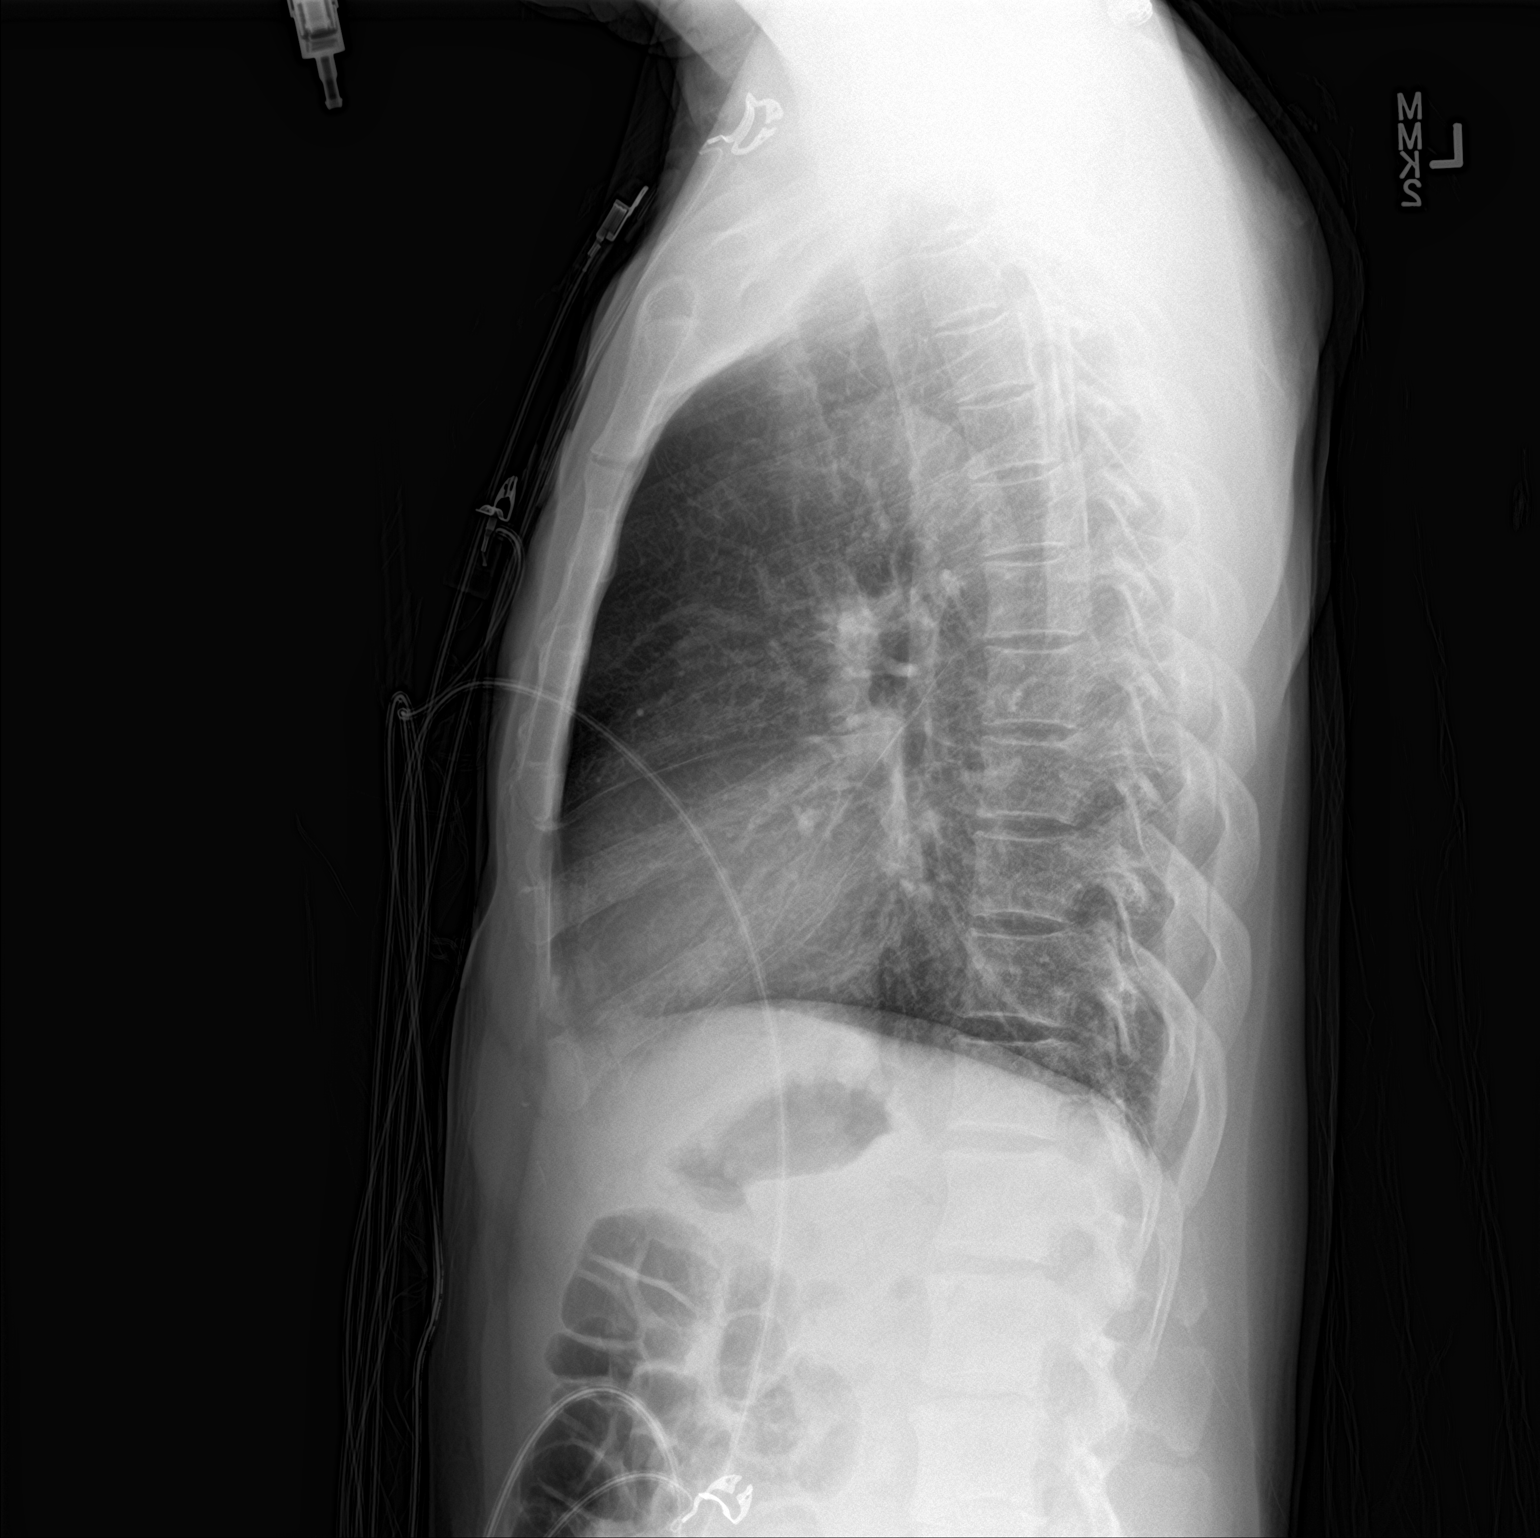

[2 of 2 positions shown; findings below may reference images not displayed]

FINDINGS: The heart size and mediastinal contours are within normal limits.
Both lungs are clear. Remote healed fracture deformities of the left
sixth and seventh ribs.
IMPRESSION: No active cardiopulmonary disease.

## 2018-11-21 MED ORDER — SODIUM CHLORIDE 0.9% FLUSH: 3.0000 mL | Freq: Once | INTRAVENOUS | Status: DC

## 2018-11-21 MED ORDER — SODIUM CHLORIDE 0.9 % IV BOLUS: 1000.0000 mL | Freq: Once | INTRAVENOUS | Status: DC

## 2018-11-21 NOTE — ED Notes (Addendum)
Pt in peds with daughter to let her be seen first, speaks Swahili primarily.

## 2018-11-21 NOTE — ED Notes (Signed)
Pt needs to be seen by social work for financial assistance form.

## 2018-11-21 NOTE — ED Triage Notes (Signed)
Pt arrives with complaints of feeling generally unwell and with generalized  chest tightness for 2 weeks. Pt also endorses productive cough and burning with urination. Pt requesting 2 weeks off of work. Swahili interpreter used during triage.

## 2018-11-21 NOTE — ED Notes (Signed)
Notified EDP pt refusal of IVF.
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
## 2018-11-21 NOTE — ED Notes (Addendum)
Patient refused transported to X-ray

## 2019-01-01 ENCOUNTER — Encounter (HOSPITAL_COMMUNITY): Payer: Self-pay

## 2019-01-01 ENCOUNTER — Emergency Department (HOSPITAL_COMMUNITY)
Admission: EM | Admit: 2019-01-01 | Discharge: 2019-01-02 | Disposition: A | Discharge status: Home / Self Care | Payer: BLUE CROSS/BLUE SHIELD | Attending: Emergency Medicine | Admitting: Emergency Medicine

## 2019-01-01 ENCOUNTER — Other Ambulatory Visit: Payer: Self-pay

## 2019-01-01 ENCOUNTER — Emergency Department (HOSPITAL_BASED_OUTPATIENT_CLINIC_OR_DEPARTMENT_OTHER): Payer: BLUE CROSS/BLUE SHIELD

## 2019-01-01 DIAGNOSIS — F17210 Nicotine dependence, cigarettes, uncomplicated: Secondary | ICD-10-CM | POA: Diagnosis not present

## 2019-01-01 DIAGNOSIS — Z79899 Other long term (current) drug therapy: Secondary | ICD-10-CM | POA: Insufficient documentation

## 2019-01-01 DIAGNOSIS — M79604 Pain in right leg: Secondary | ICD-10-CM | POA: Diagnosis present

## 2019-01-01 DIAGNOSIS — R52 Pain, unspecified: Secondary | ICD-10-CM

## 2019-01-01 DIAGNOSIS — M79609 Pain in unspecified limb: Secondary | ICD-10-CM | POA: Diagnosis not present

## 2019-01-01 MED ORDER — OXYCODONE-ACETAMINOPHEN 5-325 MG PO TABS
1.0000 | ORAL_TABLET | ORAL | Status: AC | PRN
  Administered 2019-01-01 – 2019-01-02 (×2): 1 via ORAL
  Filled 2019-01-01 (×2): qty 1

## 2019-01-01 NOTE — ED Notes (Signed)
Pt called x8 times over course of hour. No reply.

## 2019-01-01 NOTE — Progress Notes (Signed)
RLE venous duplex       has been completed. Preliminary results can be found under CV proc through chart review. Marcus James, BS, RDMS, RVT   

## 2019-01-01 NOTE — ED Triage Notes (Signed)
Pt c.o right thigh pain, leg is swollen and tender to touch. Pt sent here from family medicine for doppler study.
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
## 2019-01-02 ENCOUNTER — Emergency Department (HOSPITAL_COMMUNITY): Payer: BLUE CROSS/BLUE SHIELD

## 2019-01-02 LAB — BASIC METABOLIC PANEL
ANION GAP: 9 (ref 5–15)
BUN: 11 mg/dL (ref 6–20)
CALCIUM: 9 mg/dL (ref 8.9–10.3)
CHLORIDE: 105 mmol/L (ref 98–111)
CO2: 23 mmol/L (ref 22–32)
Creatinine, Ser: 0.99 mg/dL (ref 0.61–1.24)
GFR calc non Af Amer: >60 mL/min (ref >60)
Glucose, Bld: 91 mg/dL (ref 70–99)
Potassium: 3.8 mmol/L (ref 3.5–5.1)
SODIUM: 137 mmol/L (ref 135–145)

## 2019-01-02 LAB — CBC WITH DIFFERENTIAL/PLATELET
ABS IMMATURE GRANULOCYTES: 0.02 10*3/uL (ref 0.00–0.07)
BASOS ABS: 0 10*3/uL (ref 0.0–0.1)
Basophils Relative: 1 %
Eosinophils Absolute: 0.2 10*3/uL (ref 0.0–0.5)
Eosinophils Relative: 3 %
HEMATOCRIT: 43.2 % (ref 39.0–52.0)
HEMOGLOBIN: 14.3 g/dL (ref 13.0–17.0)
IMMATURE GRANULOCYTES: 0 %
LYMPHS ABS: 3 10*3/uL (ref 0.7–4.0)
Lymphocytes Relative: 49 %
MCH: 29.7 pg (ref 26.0–34.0)
MCHC: 33.1 g/dL (ref 30.0–36.0)
MCV: 89.8 fL (ref 80.0–100.0)
Monocytes Absolute: 0.5 10*3/uL (ref 0.1–1.0)
Monocytes Relative: 9 %
NEUTROS ABS: 2.3 10*3/uL (ref 1.7–7.7)
NEUTROS PCT: 38 %
NRBC: 0 % (ref 0.0–0.2)
Platelets: 277 10*3/uL (ref 150–400)
RBC: 4.81 MIL/uL (ref 4.22–5.81)
RDW: 13.7 % (ref 11.5–15.5)
WBC: 6 10*3/uL (ref 4.0–10.5)

## 2019-01-02 LAB — CK: Total CK: 111 U/L (ref 49–397)

## 2019-01-02 IMAGING — CR RIGHT FEMUR 2 VIEWS
4 series · 4 of 4 positions shown · non-contrast
Comparison: None.

CLINICAL DATA: Leg swelling and tenderness.  Right thigh pain.

EXAM:
RIGHT FEMUR 2 VIEWS

[femur ap (1 of 2)]
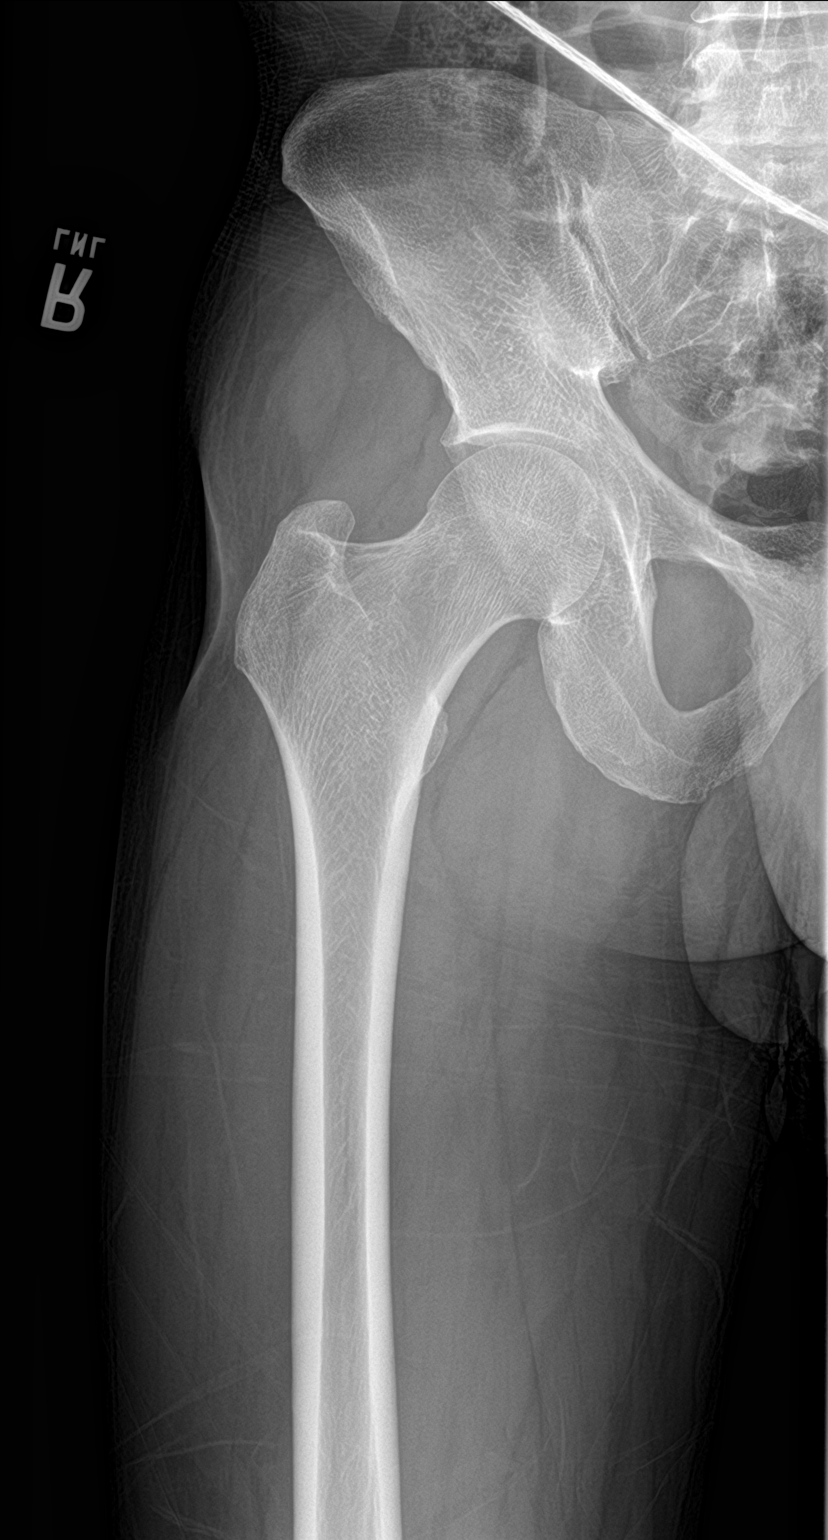

[femur ap (2 of 2)]
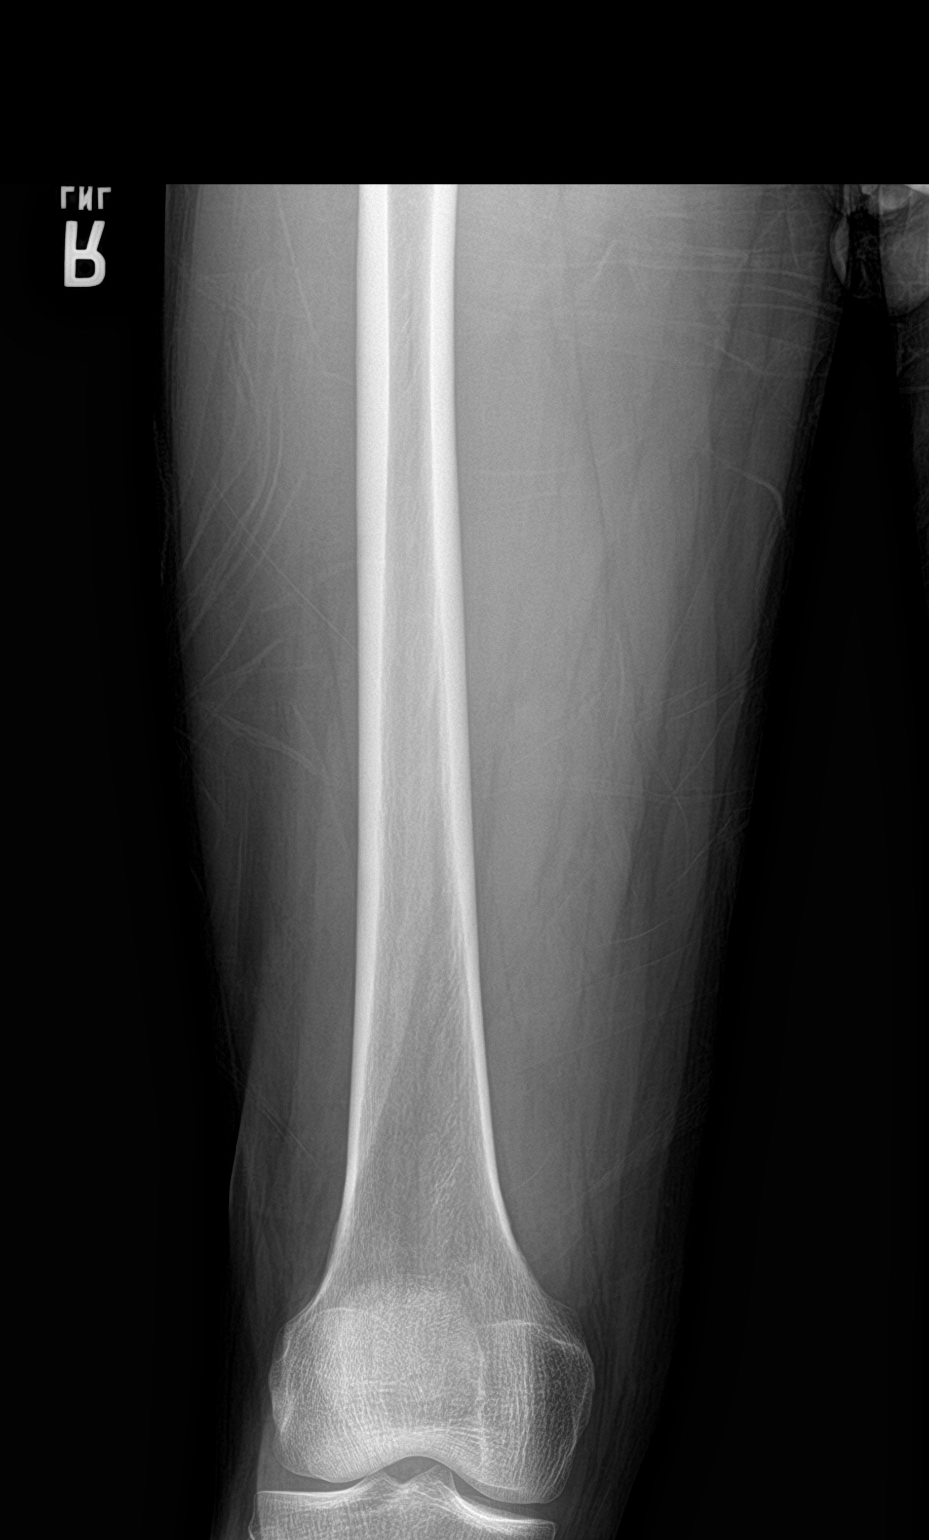

[femur lat (1 of 2)]
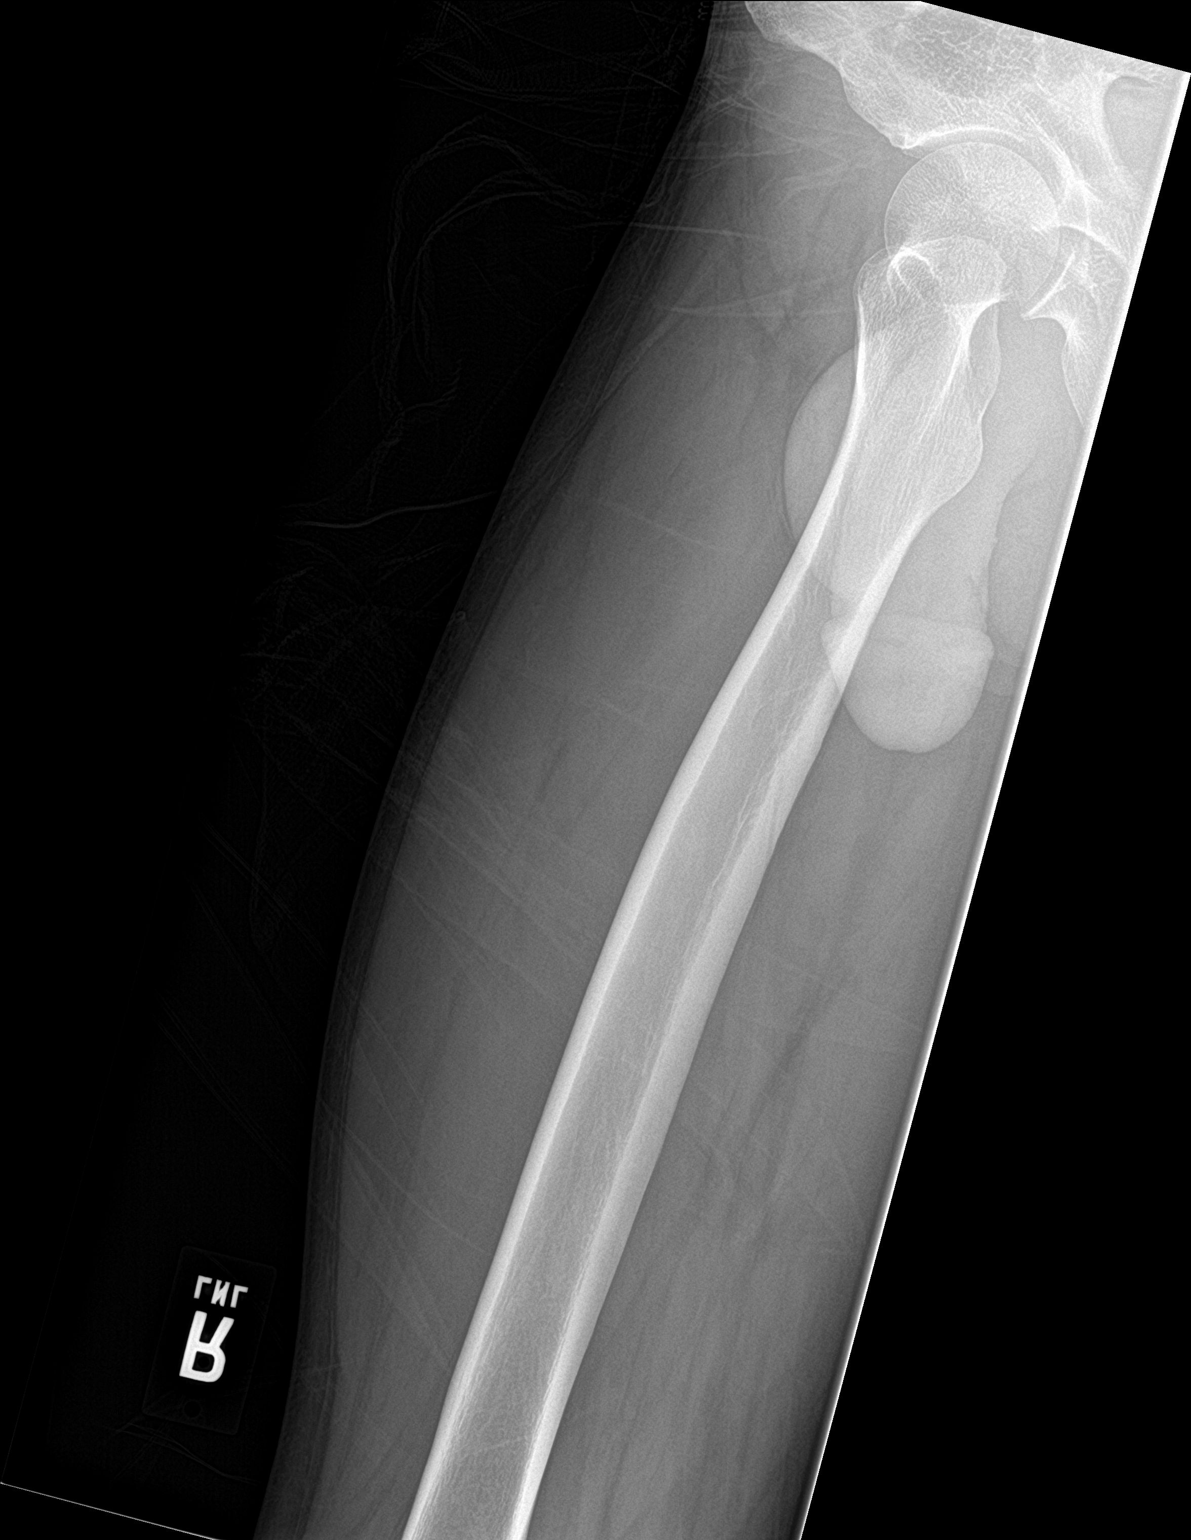

[femur lat (2 of 2)]
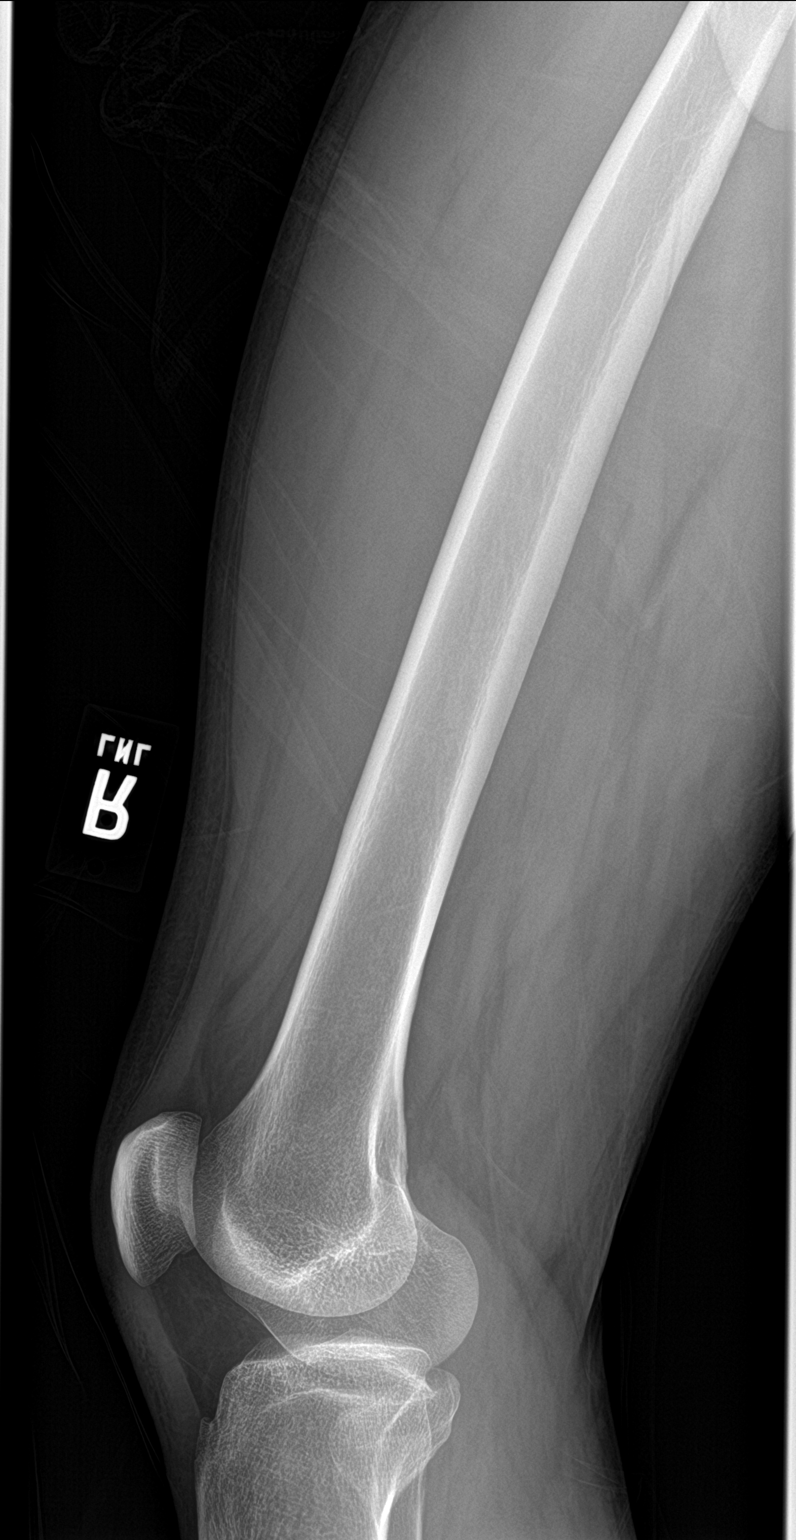

[4 of 4 positions shown; findings below may reference images not displayed]

FINDINGS: There is no evidence of fracture or other focal bone lesions. Soft
tissues are unremarkable.
IMPRESSION: Negative.

## 2019-01-02 IMAGING — CR RIGHT TIBIA AND FIBULA - 2 VIEW
4 series · 4 of 4 positions shown · non-contrast
Comparison: None.

CLINICAL DATA: Right leg pain and swelling.

EXAM:
RIGHT TIBIA AND FIBULA - 2 VIEW

[tibia ap (1 of 2)]
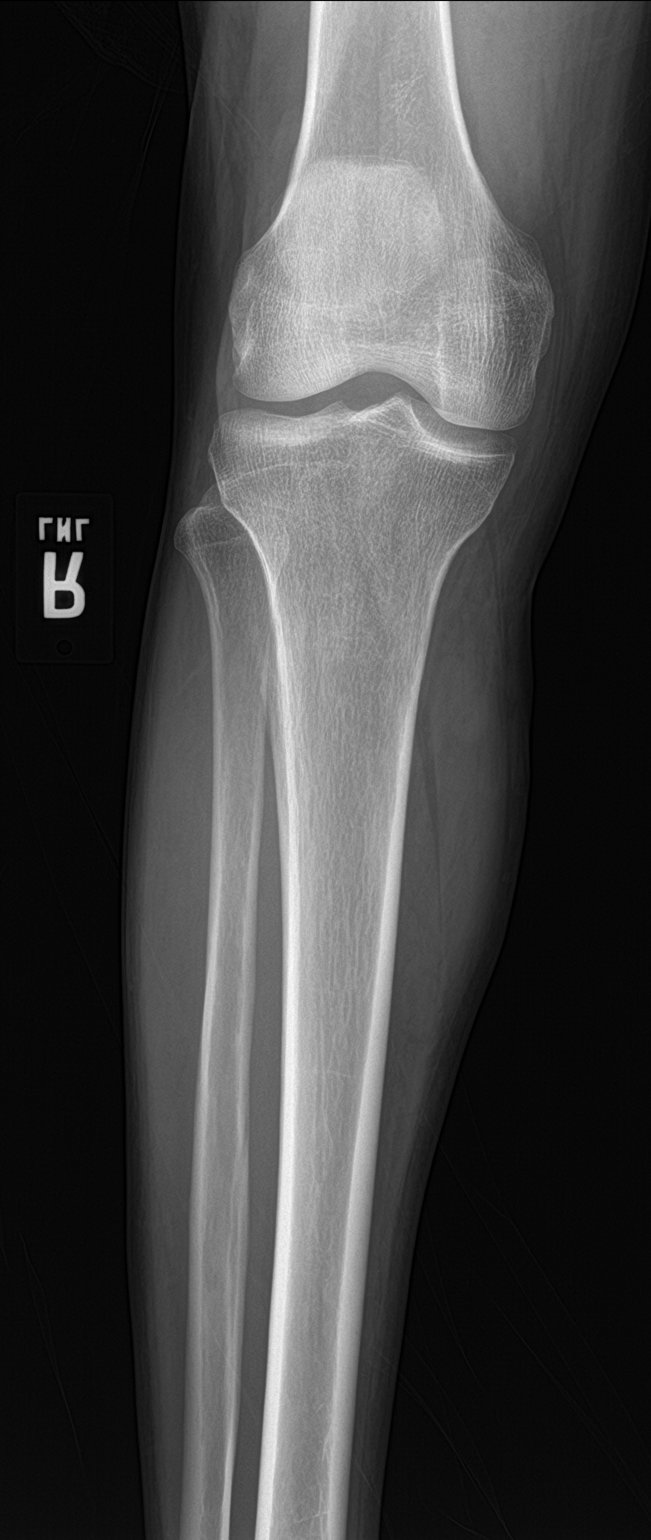

[tibia ap (2 of 2)]
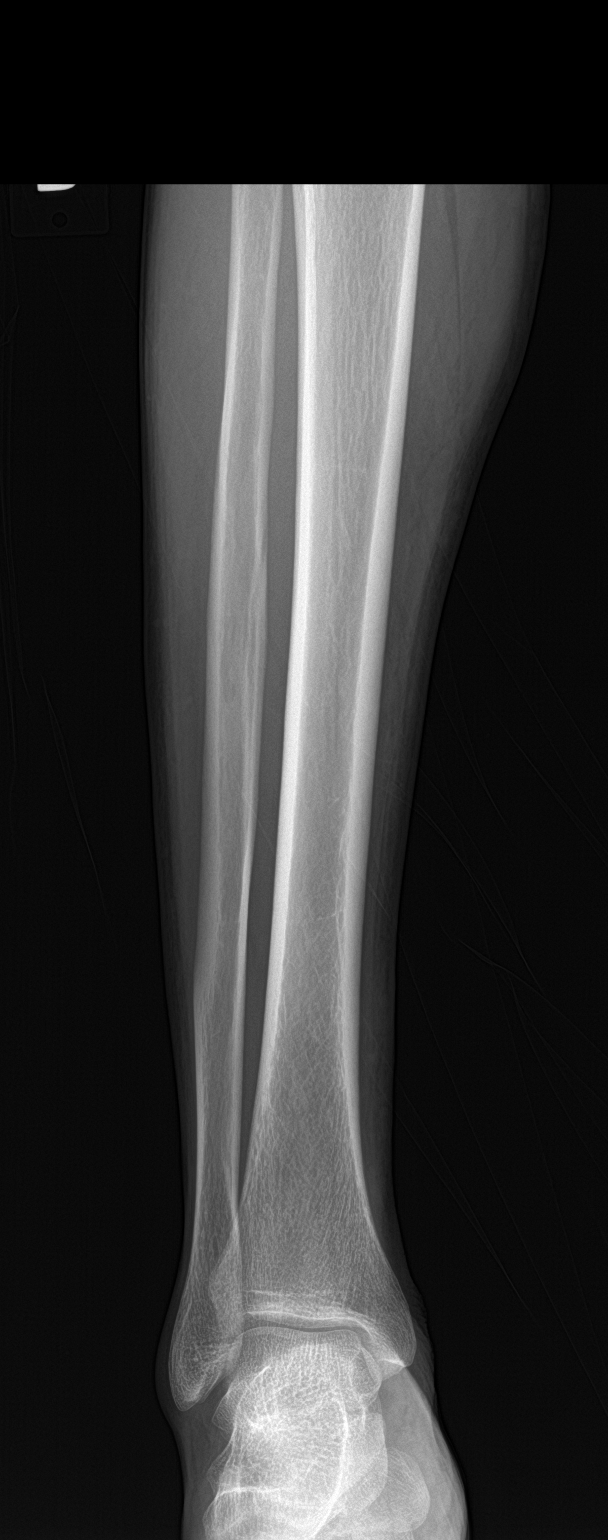

[tibia lat (1 of 2)]
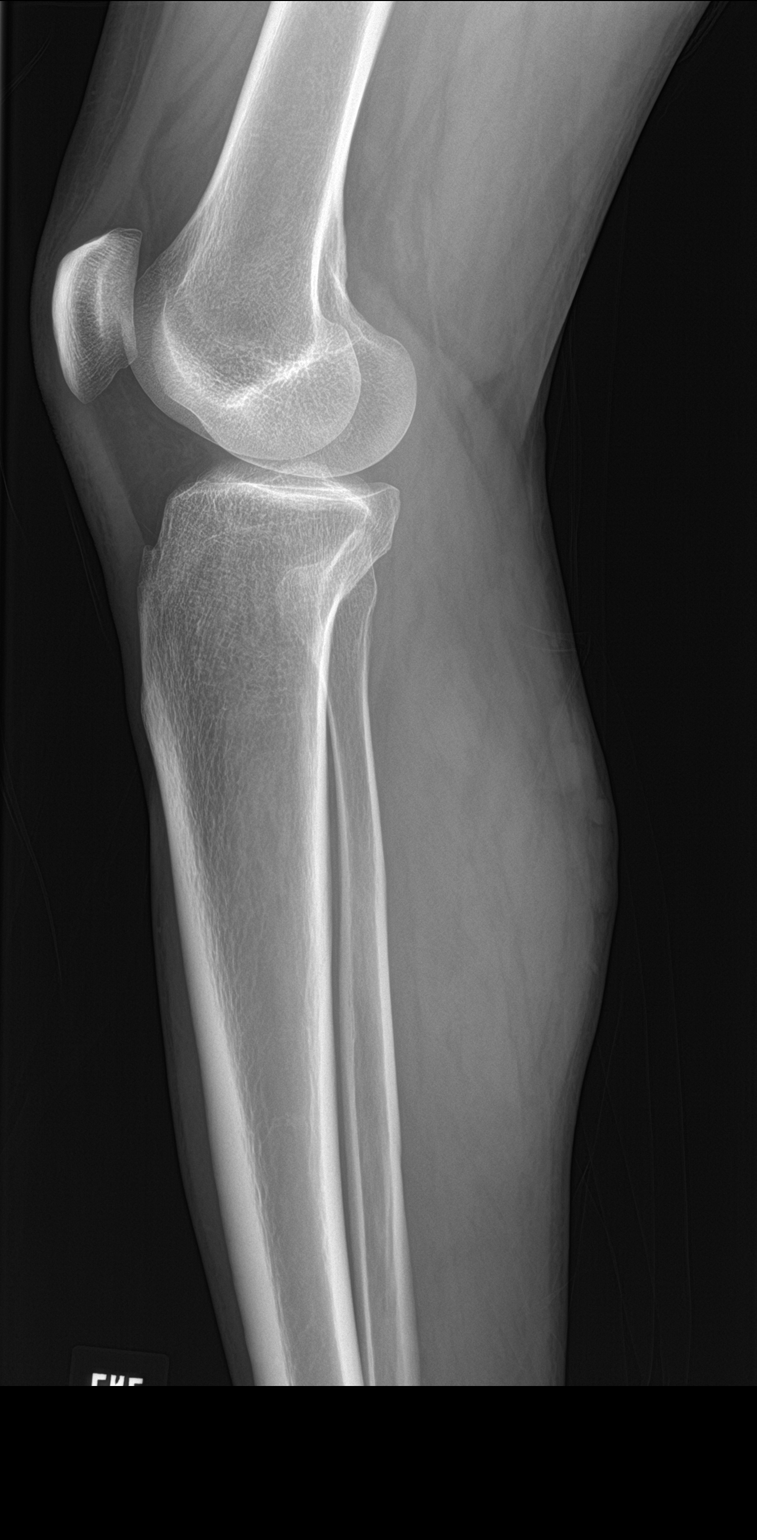

[tibia lat (2 of 2)]
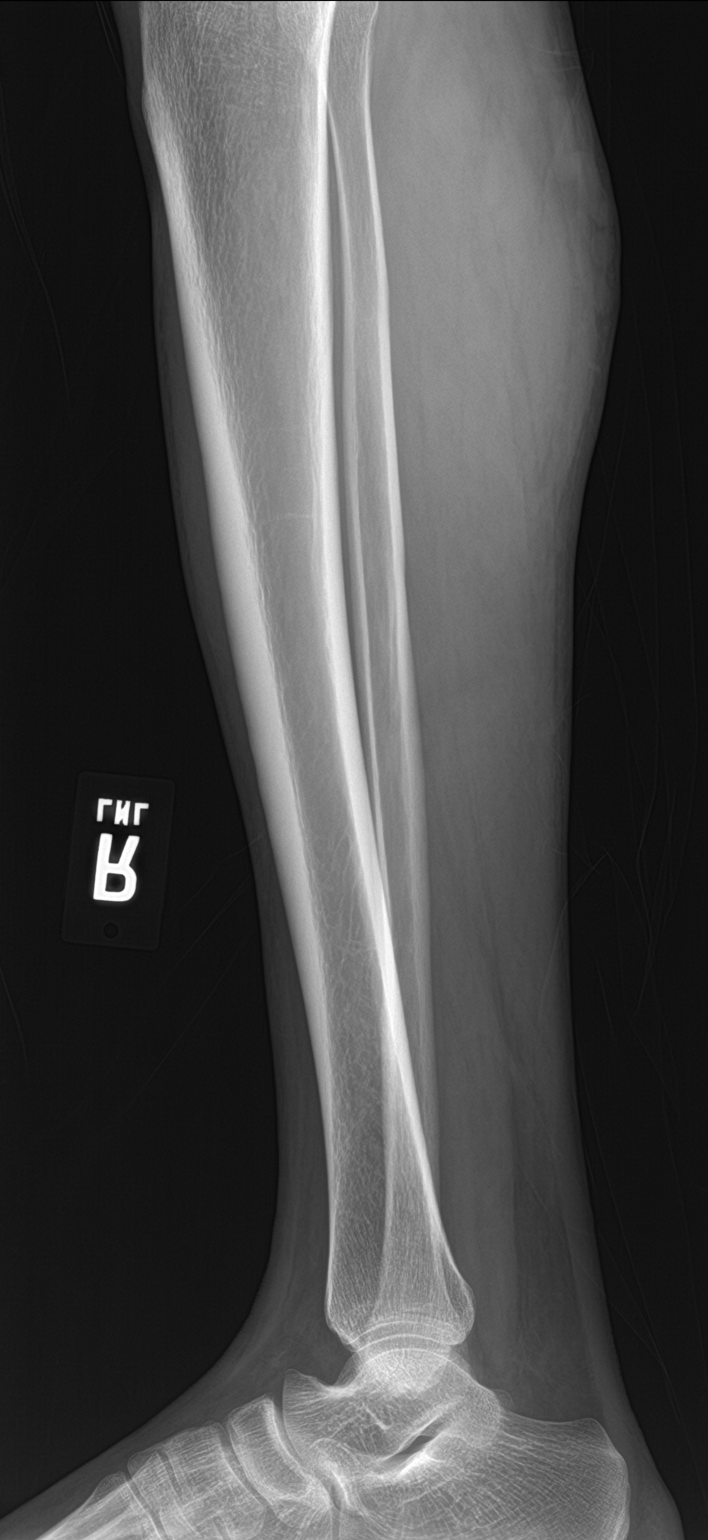

[4 of 4 positions shown; findings below may reference images not displayed]

FINDINGS: There is no evidence of fracture or other focal bone lesions.
Tubular soft tissue densities along the posterior calf likely
represent varicosities.
IMPRESSION: Tubular densities in the subcutaneous soft tissues of the calf
compatible with varicose veins. No acute osseous abnormality.
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
## 2020-09-25 ENCOUNTER — Encounter (HOSPITAL_COMMUNITY): Payer: Self-pay | Admitting: *Deleted

## 2020-09-25 ENCOUNTER — Other Ambulatory Visit: Payer: Self-pay

## 2020-09-25 ENCOUNTER — Ambulatory Visit (HOSPITAL_COMMUNITY)
Admission: EM | Admit: 2020-09-25 | Discharge: 2020-09-25 | Disposition: A | Discharge status: Home / Self Care | Payer: Medicaid Other | Attending: Emergency Medicine | Admitting: Emergency Medicine

## 2020-09-25 DIAGNOSIS — R519 Headache, unspecified: Secondary | ICD-10-CM

## 2020-09-25 DIAGNOSIS — M79601 Pain in right arm: Secondary | ICD-10-CM

## 2020-09-25 MED ORDER — IBUPROFEN 600 MG PO TABS: 600.0000 mg | ORAL_TABLET | Freq: Four times a day (QID) | ORAL | 0 refills | Status: DC | PRN

## 2020-09-25 NOTE — Discharge Instructions (Signed)
Take the ibuprofen as needed for discomfort.    Go to the emergency department if you have acute worsening symptoms.    Follow up with your primary care provider if your symptoms are not improving.
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
## 2020-09-25 NOTE — ED Triage Notes (Signed)
C/O constant HA x 1 day.  C/O Right arm throbbing pain x 1 day - initially right upper arm and forearm pain much worse with any movement.

## 2021-10-04 ENCOUNTER — Other Ambulatory Visit: Payer: Self-pay

## 2021-10-04 ENCOUNTER — Inpatient Hospital Stay (HOSPITAL_COMMUNITY)
Admission: EM | Admit: 2021-10-04 | Discharge: 2021-10-09 | DRG: 460 | Disposition: A | Discharge status: Home / Self Care | Payer: Medicaid Other | Attending: Neurological Surgery | Admitting: Neurological Surgery

## 2021-10-04 ENCOUNTER — Encounter (HOSPITAL_COMMUNITY): Payer: Self-pay

## 2021-10-04 ENCOUNTER — Emergency Department (HOSPITAL_COMMUNITY): Payer: Medicaid Other

## 2021-10-04 DIAGNOSIS — R531 Weakness: Secondary | ICD-10-CM

## 2021-10-04 DIAGNOSIS — Z20822 Contact with and (suspected) exposure to covid-19: Secondary | ICD-10-CM | POA: Diagnosis present

## 2021-10-04 DIAGNOSIS — G9520 Unspecified cord compression: Secondary | ICD-10-CM

## 2021-10-04 DIAGNOSIS — M4712 Other spondylosis with myelopathy, cervical region: Secondary | ICD-10-CM | POA: Diagnosis present

## 2021-10-04 DIAGNOSIS — F17210 Nicotine dependence, cigarettes, uncomplicated: Secondary | ICD-10-CM | POA: Diagnosis present

## 2021-10-04 DIAGNOSIS — M4802 Spinal stenosis, cervical region: Principal | ICD-10-CM | POA: Diagnosis present

## 2021-10-04 DIAGNOSIS — Z981 Arthrodesis status: Secondary | ICD-10-CM

## 2021-10-04 DIAGNOSIS — Z79899 Other long term (current) drug therapy: Secondary | ICD-10-CM

## 2021-10-04 DIAGNOSIS — R0902 Hypoxemia: Secondary | ICD-10-CM

## 2021-10-04 DIAGNOSIS — Z419 Encounter for procedure for purposes other than remedying health state, unspecified: Secondary | ICD-10-CM

## 2021-10-04 DIAGNOSIS — G959 Disease of spinal cord, unspecified: Secondary | ICD-10-CM | POA: Diagnosis present

## 2021-10-04 LAB — CBC
HCT: 44.3 % (ref 39.0–52.0)
Hemoglobin: 14.3 g/dL (ref 13.0–17.0)
MCH: 30.5 pg (ref 26.0–34.0)
MCHC: 32.3 g/dL (ref 30.0–36.0)
MCV: 94.5 fL (ref 80.0–100.0)
Platelets: 150 10*3/uL (ref 150–400)
RBC: 4.69 MIL/uL (ref 4.22–5.81)
RDW: 13 % (ref 11.5–15.5)
WBC: 4.7 10*3/uL (ref 4.0–10.5)
nRBC: 0 % (ref 0.0–0.2)

## 2021-10-04 LAB — BASIC METABOLIC PANEL
Anion gap: 6 (ref 5–15)
BUN: 7 mg/dL (ref 6–20)
CO2: 26 mmol/L (ref 22–32)
Calcium: 9.1 mg/dL (ref 8.9–10.3)
Chloride: 104 mmol/L (ref 98–111)
Creatinine, Ser: 0.78 mg/dL (ref 0.61–1.24)
GFR, Estimated: >60 mL/min (ref >60)
Glucose, Bld: 107 mg/dL — ABNORMAL HIGH (ref 70–99)
Potassium: 3.8 mmol/L (ref 3.5–5.1)
Sodium: 136 mmol/L (ref 135–145)

## 2021-10-04 LAB — RESP PANEL BY RT-PCR (FLU A&B, COVID) ARPGX2
Influenza A by PCR: POSITIVE — AB
Influenza B by PCR: NEGATIVE
SARS Coronavirus 2 by RT PCR: NEGATIVE

## 2021-10-04 IMAGING — CR DG CHEST 2V
2 series · 2 of 2 positions shown · non-contrast
Comparison: [DATE]

CLINICAL DATA: Cough

EXAM:
CHEST - 2 VIEW

[chest pa]
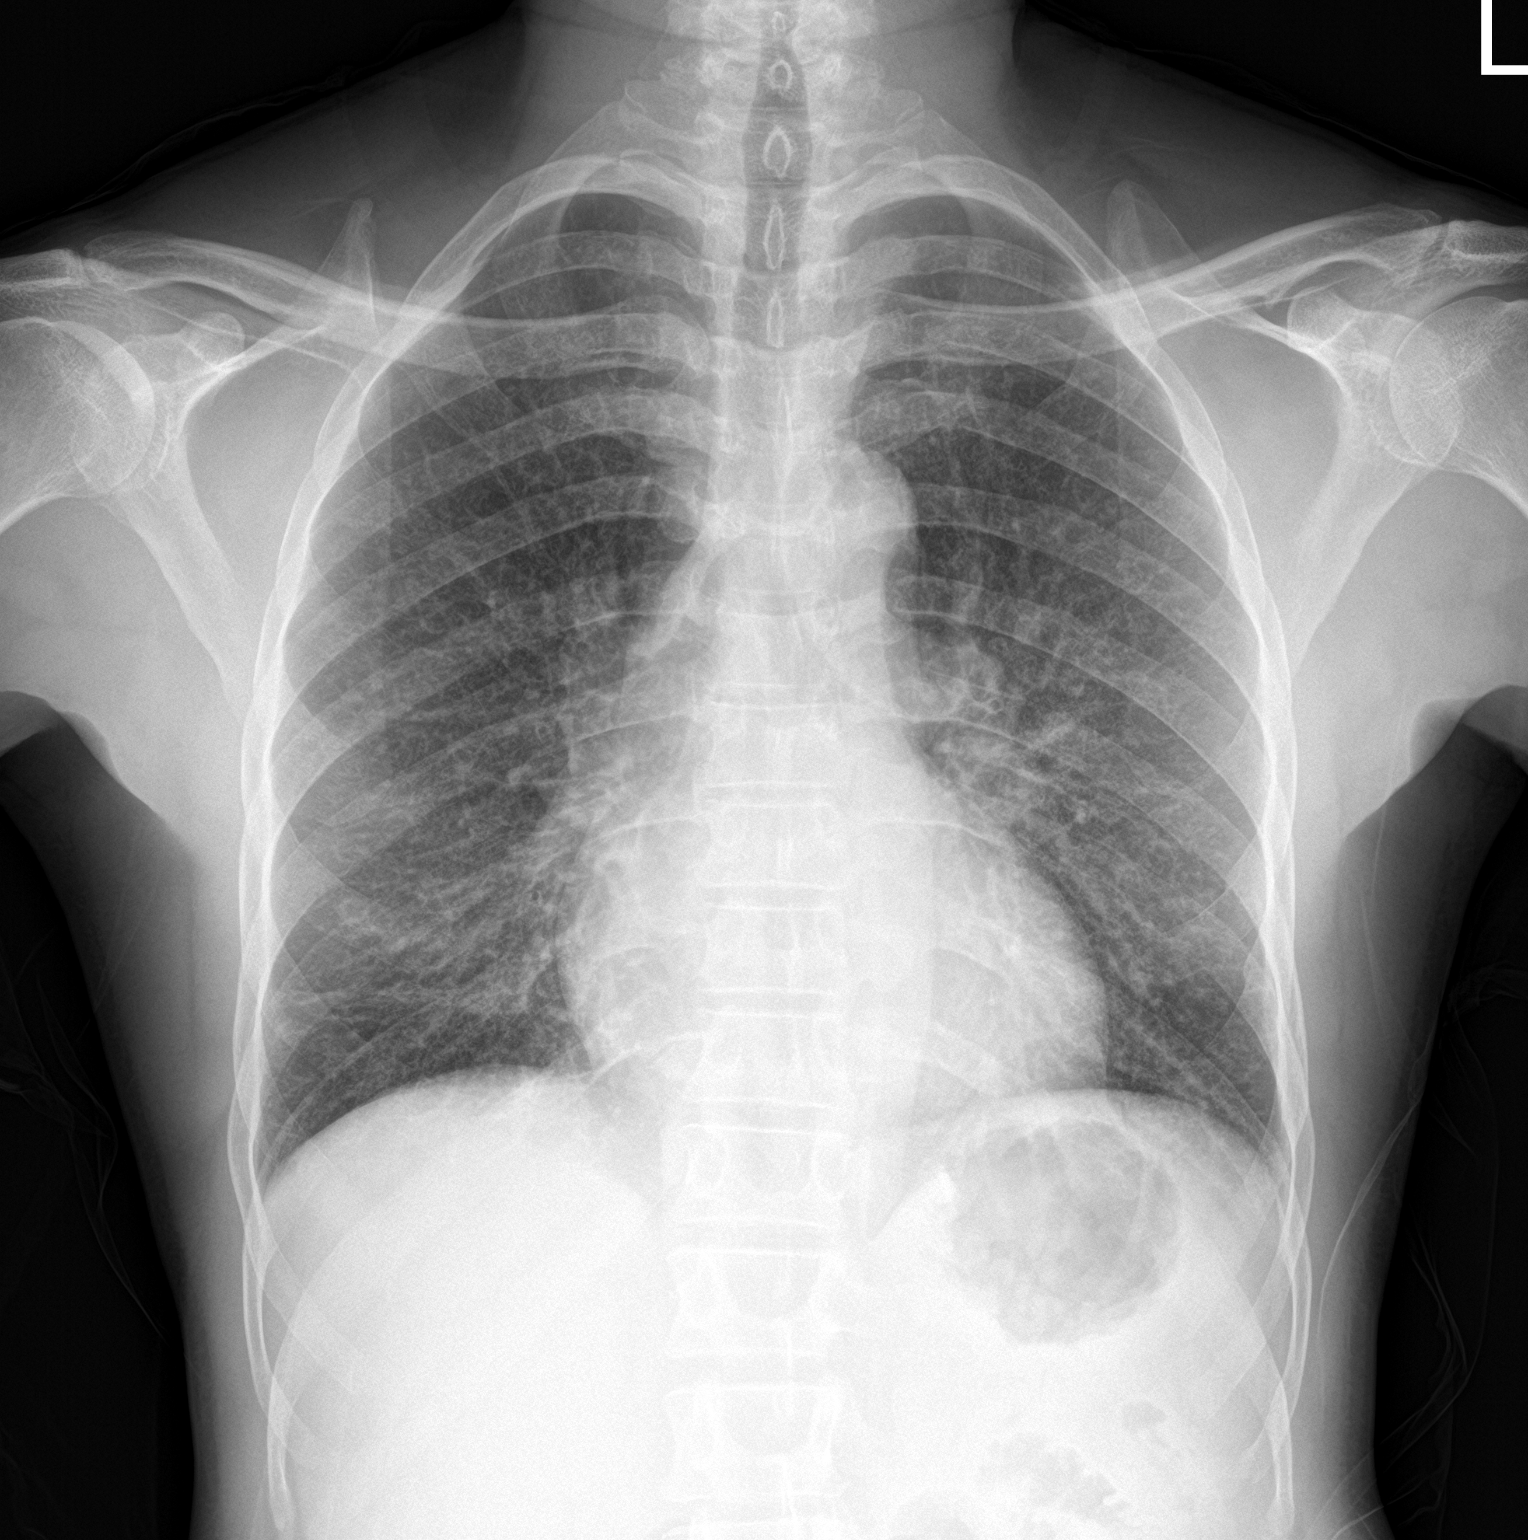

[chest lat]
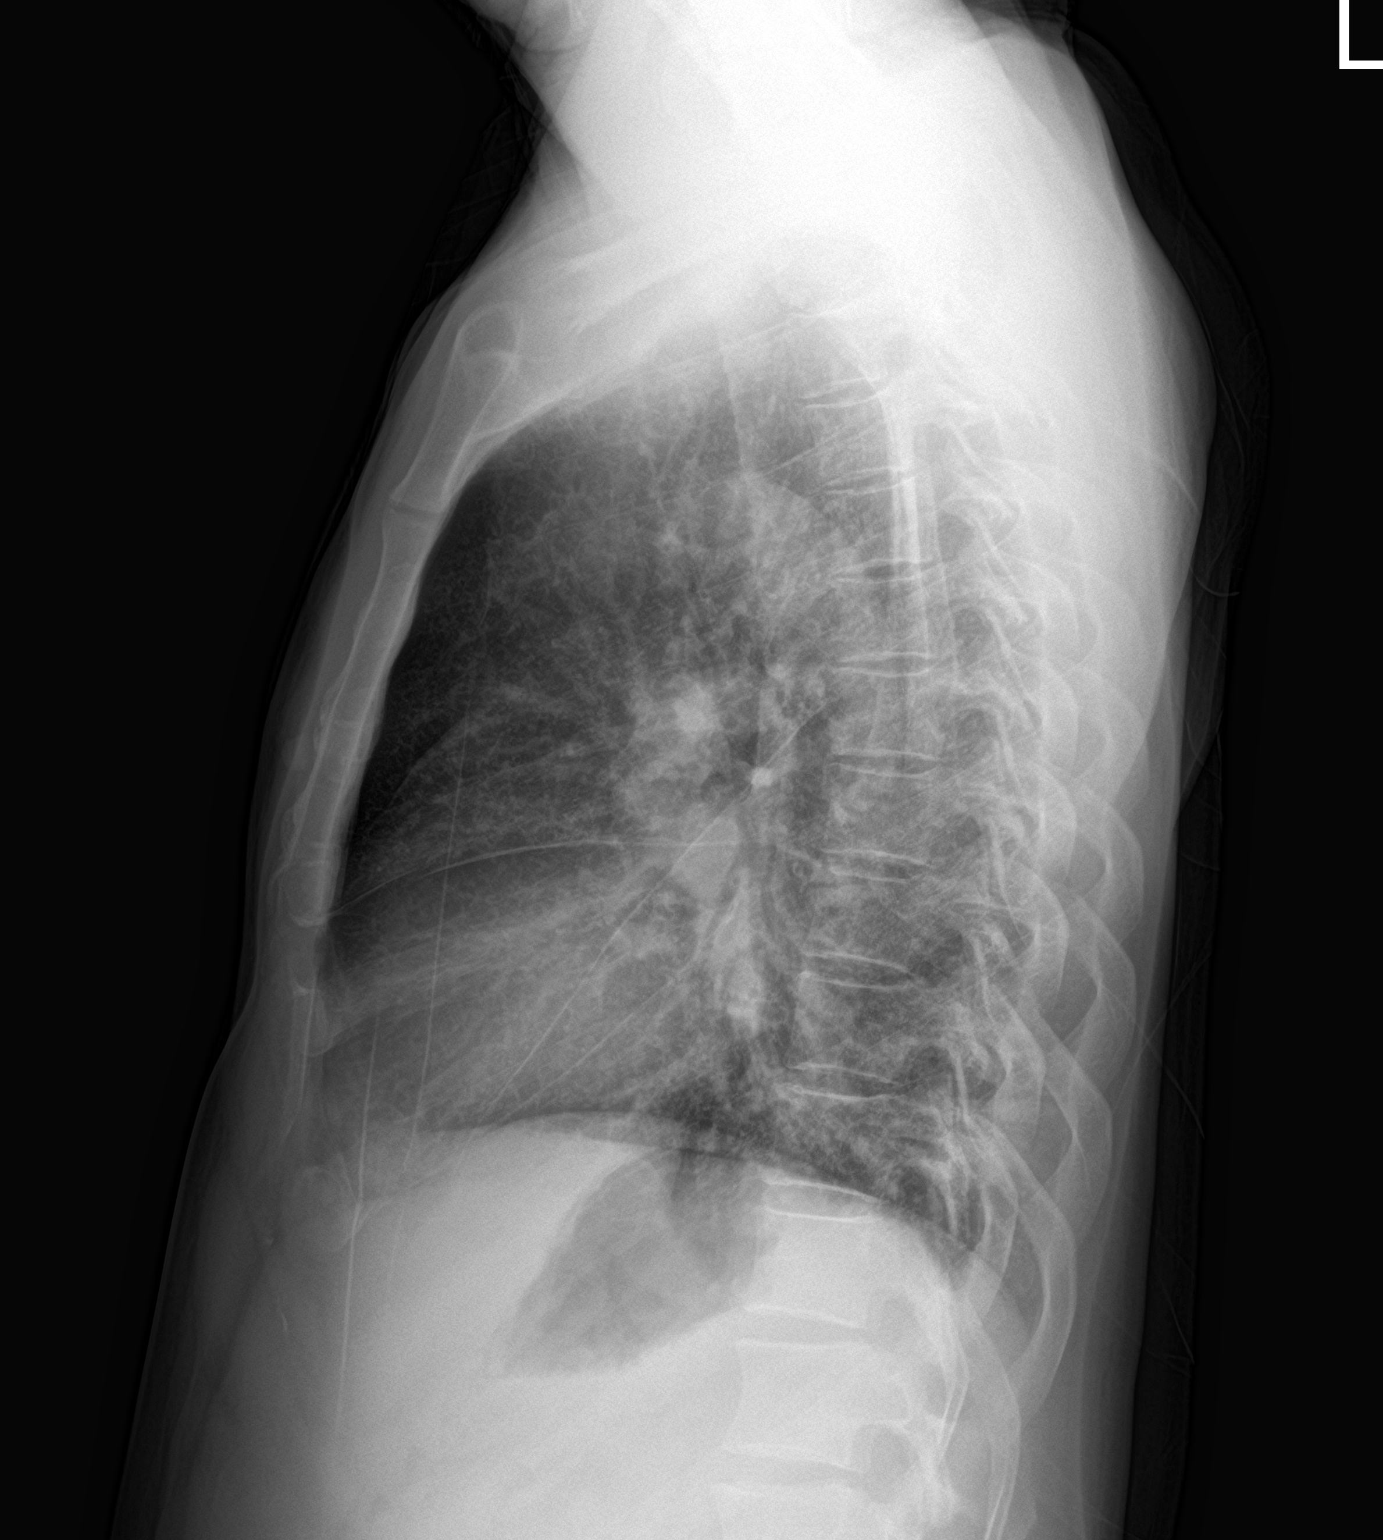

[2 of 2 positions shown; findings below may reference images not displayed]

FINDINGS: Mild peribronchial thickening and interstitial prominence. Heart is
normal size. No effusions or acute bony abnormality.
IMPRESSION: Peribronchial thickening and interstitial prominence could reflect
bronchitic changes or atypical/viral infection.

## 2021-10-04 NOTE — ED Triage Notes (Signed)
Pt here for cough that's been ongoing for the past 3 weeks. Pt states that he's been having cold and flu symptoms. Pt also stating that he's been having difficulty walking d/t generalized numbness in bilateral lower extremities and right arm.

## 2021-10-04 NOTE — ED Provider Notes (Signed)
Emergency Medicine Provider Triage Evaluation Note  Marcus James , a 55 y.o. male  was evaluated in triage.  Pt complains of 3-week duration of cough, generalized body aches.  He denies shortness of breath, chest pain, lightheadedness, nausea, or vomiting..  Review of Systems  Positive: As above Negative: As above  Physical Exam  BP 130/83 (BP Location: Right Arm)    Pulse 60    Temp 98.7 F (37.1 C)    Resp 16    SpO2 98%  Gen:   Awake, no distress   Resp:  Normal effort  MSK:   Moves extremities without difficulty  Other:    Medical Decision Making  Medically screening exam initiated at 9:15 PM.  Appropriate orders placed.  Marcus James was informed that the remainder of the evaluation will be completed by another provider, this initial triage assessment does not replace that evaluation, and the importance of remaining in the ED until their evaluation is complete.     Marcus [place], PA-C 10/04/21 2116    Lorre Nick, MD 10/07/21 [phone]

## 2021-10-05 ENCOUNTER — Encounter (HOSPITAL_COMMUNITY): Payer: Self-pay | Admitting: Student

## 2021-10-05 ENCOUNTER — Emergency Department (HOSPITAL_COMMUNITY): Payer: Medicaid Other

## 2021-10-05 ENCOUNTER — Other Ambulatory Visit: Payer: Self-pay | Admitting: Neurological Surgery

## 2021-10-05 DIAGNOSIS — M4712 Other spondylosis with myelopathy, cervical region: Secondary | ICD-10-CM | POA: Diagnosis present

## 2021-10-05 DIAGNOSIS — M4802 Spinal stenosis, cervical region: Secondary | ICD-10-CM | POA: Diagnosis present

## 2021-10-05 DIAGNOSIS — Z79899 Other long term (current) drug therapy: Secondary | ICD-10-CM | POA: Diagnosis not present

## 2021-10-05 DIAGNOSIS — G959 Disease of spinal cord, unspecified: Secondary | ICD-10-CM | POA: Diagnosis present

## 2021-10-05 DIAGNOSIS — G9520 Unspecified cord compression: Secondary | ICD-10-CM | POA: Diagnosis present

## 2021-10-05 DIAGNOSIS — F17210 Nicotine dependence, cigarettes, uncomplicated: Secondary | ICD-10-CM | POA: Diagnosis present

## 2021-10-05 DIAGNOSIS — Z20822 Contact with and (suspected) exposure to covid-19: Secondary | ICD-10-CM | POA: Diagnosis present

## 2021-10-05 LAB — HEPATIC FUNCTION PANEL
ALT: 40 U/L (ref 0–44)
AST: 47 U/L — ABNORMAL HIGH (ref 15–41)
Albumin: 3.1 g/dL — ABNORMAL LOW (ref 3.5–5.0)
Alkaline Phosphatase: 133 U/L — ABNORMAL HIGH (ref 38–126)
Bilirubin, Direct: 0.1 mg/dL (ref 0.0–0.2)
Indirect Bilirubin: 0.3 mg/dL (ref 0.3–0.9)
Total Bilirubin: 0.4 mg/dL (ref 0.3–1.2)
Total Protein: 7 g/dL (ref 6.5–8.1)

## 2021-10-05 LAB — VITAMIN B12: Vitamin B-12: 635 pg/mL (ref 180–914)

## 2021-10-05 LAB — SEDIMENTATION RATE: Sed Rate: 14 mm/hr (ref 0–16)

## 2021-10-05 LAB — HEPATITIS C ANTIBODY: HCV Ab: NONREACTIVE

## 2021-10-05 LAB — HIV ANTIBODY (ROUTINE TESTING W REFLEX): HIV Screen 4th Generation wRfx: NONREACTIVE

## 2021-10-05 LAB — TSH: TSH: 1.371 u[IU]/mL (ref 0.350–4.500)

## 2021-10-05 LAB — C-REACTIVE PROTEIN: CRP: 0.8 mg/dL (ref <1.0)

## 2021-10-05 IMAGING — MR MR CERVICAL SPINE WO/W CM
4 of 9 series · 18 of 48 positions shown · IV contrast (Yes   GAD)
Comparison: None.

CLINICAL DATA: Progressively worsening numbness to hands

EXAM:
MRI HEAD WITHOUT AND WITH CONTRAST
MRI CERVICAL SPINE WITHOUT AND WITH CONTRAST
TECHNIQUE: Multiplanar, multiecho pulse sequences of the brain and surrounding
structures, and cervical spine, to include the craniocervical
junction and cervicothoracic junction, were obtained without and
with intravenous contrast.
CONTRAST:  5mL GADAVIST GADOBUTROL 1 MMOL/ML IV SOLN

[Series 2: T2 · sagittal · 3.0mm · 0.43mm/px · 4 of 14 slices shown (1 of 2)]
[im 1/14]
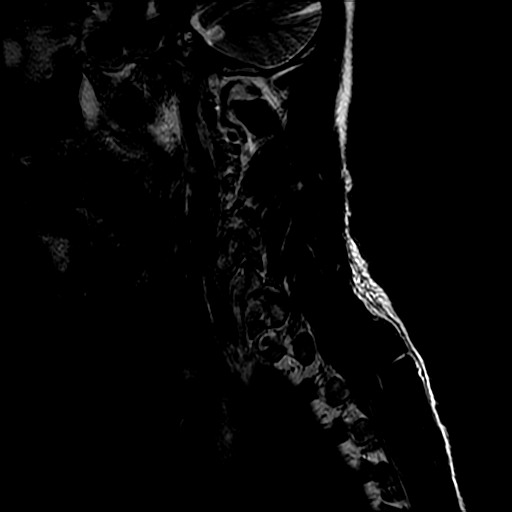
[im 5/14]
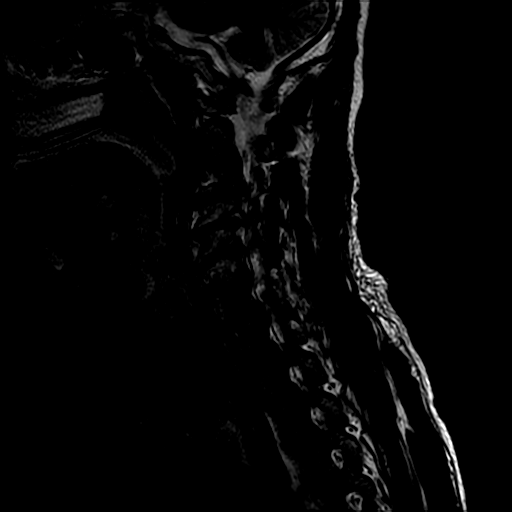
[im 9/14]
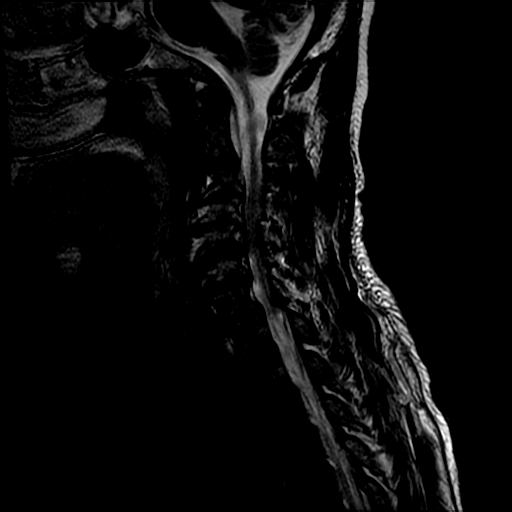
[im 14/14]
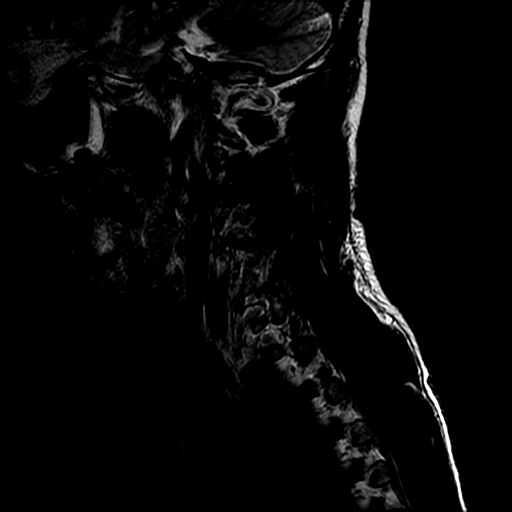

[Series 3: T1 · sagittal · 3.0mm · 0.43mm/px · 4 of 14 slices shown (1 of 2)]
[im 1/14]
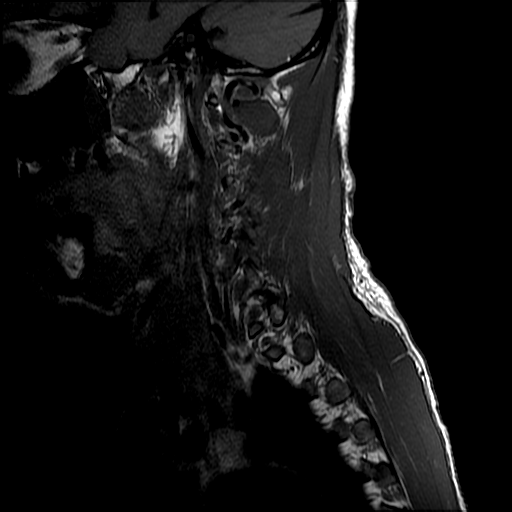
[im 5/14]
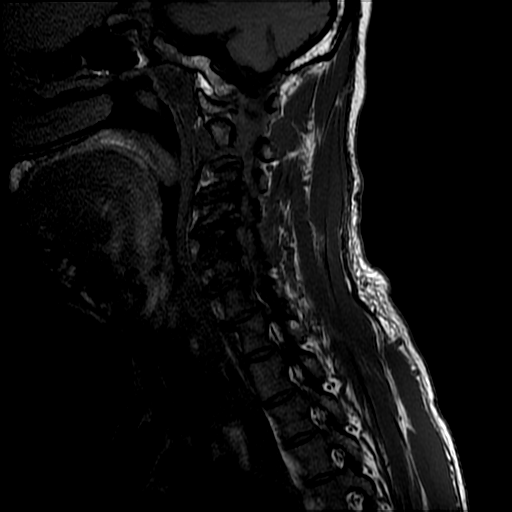
[im 9/14]
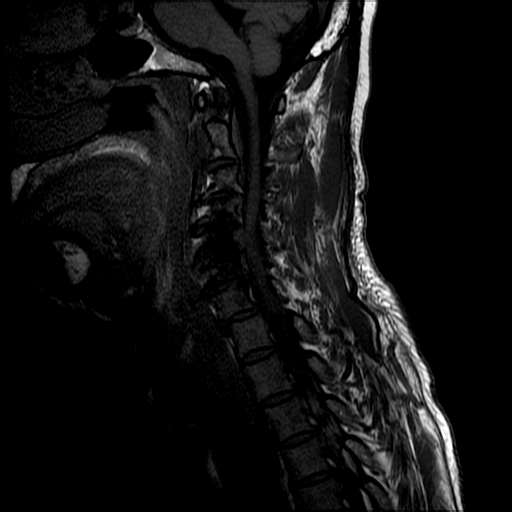
[im 14/14]
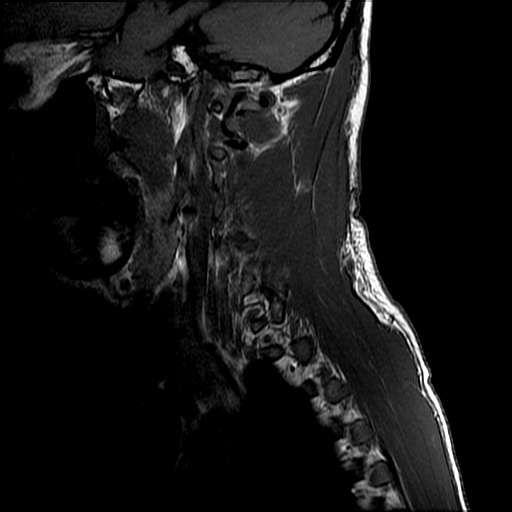

[Series 6: T2 · axial · 3.0mm · 0.39mm/px · z∈[-187,-98]mm · 7 of 25 slices shown (2 of 2)]
[im 1/25]
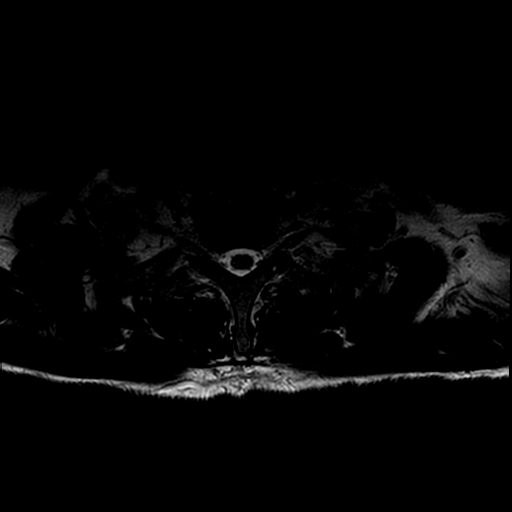
[im 5/25]
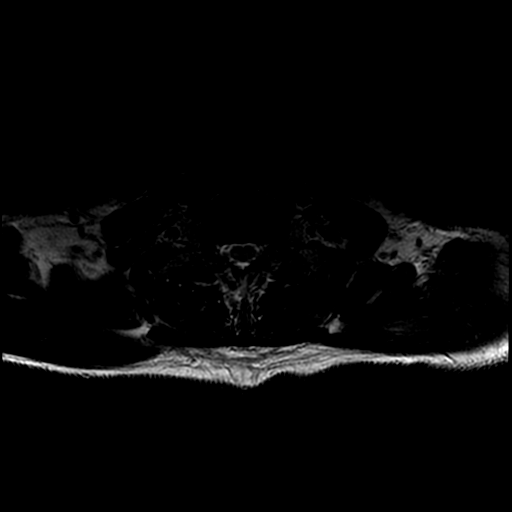
[im 9/25]
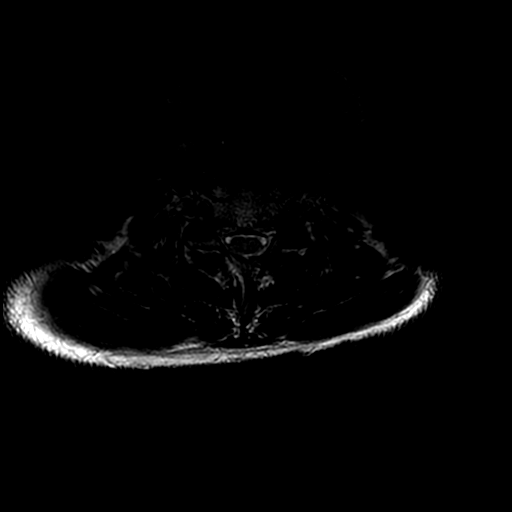
[im 13/25]
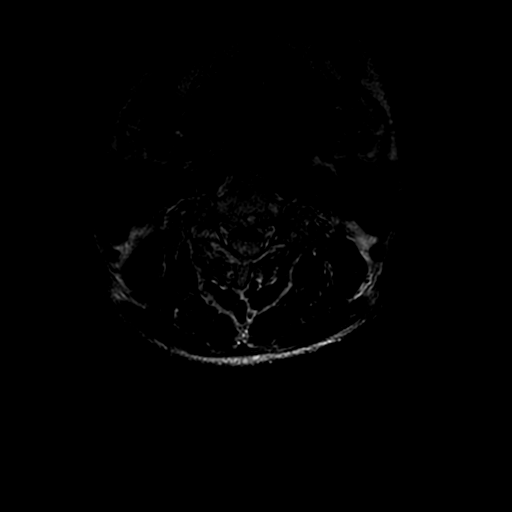
[im 17/25]
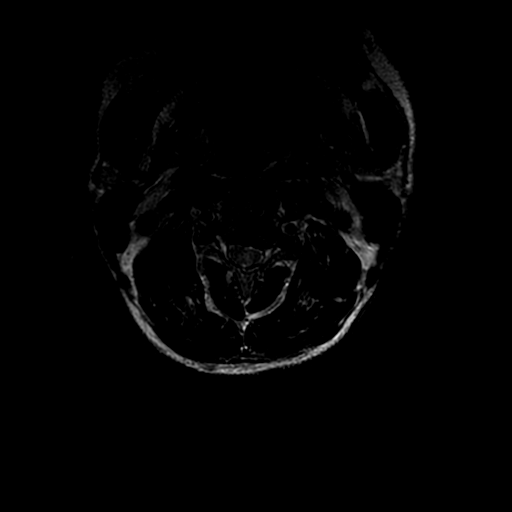
[im 21/25]
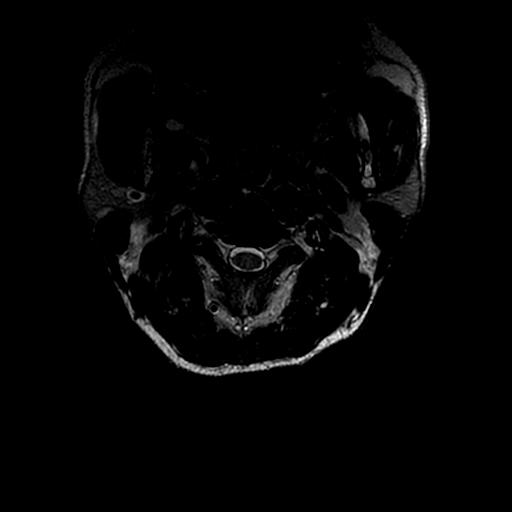
[im 25/25]
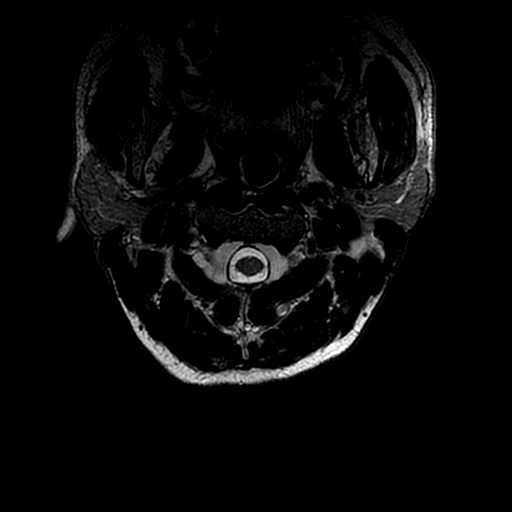

[Series 8: T1 · axial · non-contrast · 3.0mm · 0.39mm/px · z∈[-172,-97]mm · 3 of 24 slices shown (2 of 2)]
[im 5/24]
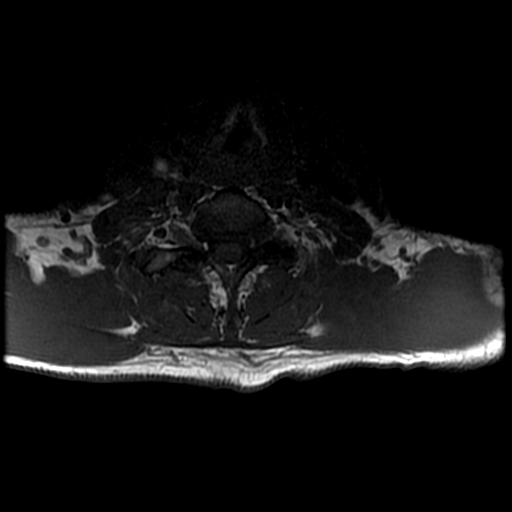
[im 14/24]
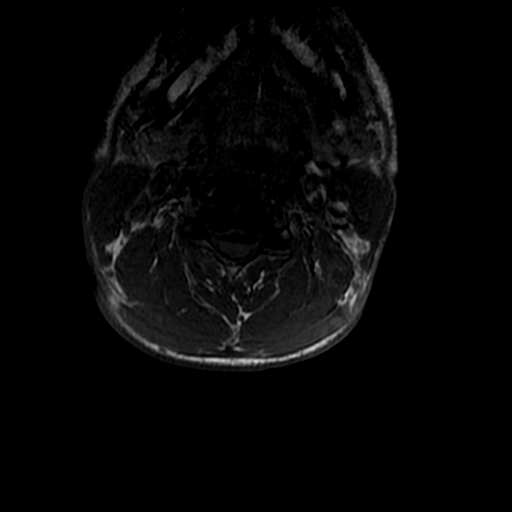
[im 24/24]
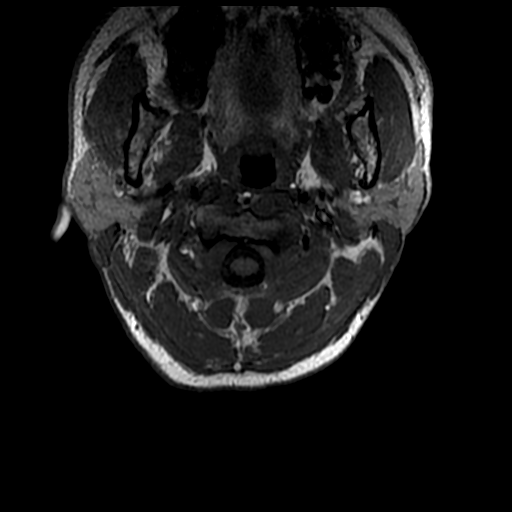

[18 of 48 positions shown; findings below may reference images not displayed]

FINDINGS: MRI HEAD FINDINGS

Brain: There is no acute infarction or intracranial hemorrhage.
There is no intracranial mass, mass effect, or edema. There is no
hydrocephalus or extra-axial fluid collection. Ventricles and sulci
are within normal limits in size and configuration. Scattered small
foci of T2 hyperintensity in the supratentorial white matter likely
reflect nonspecific gliosis/demyelination. No abnormal enhancement.

Vascular: Major vessel flow voids at the skull base are preserved.

Skull: Normal marrow signal is preserved.

Sinuses/Orbits: Mild to moderate mucosal thickening. Orbits are
unremarkable.

Other: Sella is unremarkable.  Mastoid air cells are clear.

MRI CERVICAL SPINE FINDINGS

Alignment: Mild degenerative listhesis.

Vertebrae: Multilevel degenerative endplate irregularity. Mild
marrow edema associated with right C5-C6 facets likely on a
degenerative basis.

Cord: Patchy abnormal cord T2 hyperintensity at the C4-C5 to C5-C6
levels.

Posterior Fossa, vertebral arteries, paraspinal tissues:
Unremarkable.

Disc levels:

C2-C3:  No canal or foraminal stenosis.

C3-C4: Disc bulge with endplate osteophytes. Uncovertebral
hypertrophy. Moderate canal stenosis. Moderate right and marked left
foraminal stenosis.

C4-C5: Disc bulge with endplate osteophytes. Uncovertebral
hypertrophy. Marked canal stenosis. Marked foraminal stenosis.

C5-C6: Disc bulge with endplate osteophytes. Uncovertebral
hypertrophy. Marked canal stenosis. Marked foraminal stenosis.

C6-C7: Disc bulge with endplate osteophytes. Uncovertebral
hypertrophy. Moderate canal stenosis. Marked foraminal stenosis.

C7-T1: Anterolisthesis uncovering of disc. Endplate osteophytes.
Facet hypertrophy. Mild canal stenosis. Moderate to marked right and
marked left foraminal stenosis.
IMPRESSION: Multilevel cervical spine degenerative changes as detailed above.
There is marked canal stenosis at C4-C5 and C5-C6. Abnormal cord T2
hyperintensity is present at this level likely reflecting edema.
There is also multilevel marked foraminal stenosis.

No evidence of recent infarction, hemorrhage, or mass. Minor burden
of nonspecific gliosis/demyelination in the cerebral white matter.

Preliminary cervical spine results were discussed by telephone at
the time of imaging completion on [DATE] with provider [HOSPITAL]
[facility] , who verbally acknowledged these results.

## 2021-10-05 IMAGING — MR MR THORACIC SPINE WO/W CM
4 of 9 series · 19 of 48 positions shown · IV contrast (gadavist)
Comparison: None.

CLINICAL DATA: Demyelinating disease

EXAM:
MRI THORACIC WITHOUT AND WITH CONTRAST
TECHNIQUE: Multiplanar and multiecho pulse sequences of the thoracic spine were
obtained without and with intravenous contrast.
CONTRAST:  5mL GADAVIST GADOBUTROL 1 MMOL/ML IV SOLN

[Series 12: T2 · sagittal · 3.0mm · 0.62mm/px · 4 of 16 slices shown (1 of 2)]
[im 1/16]
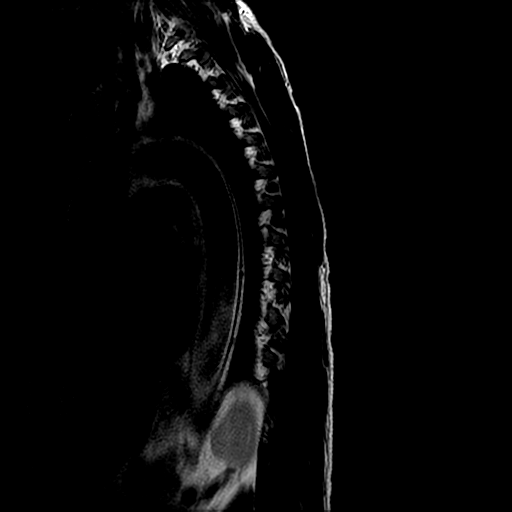
[im 6/16]
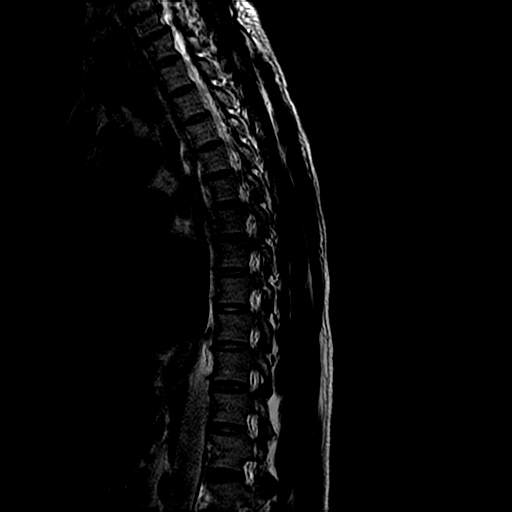
[im 11/16]
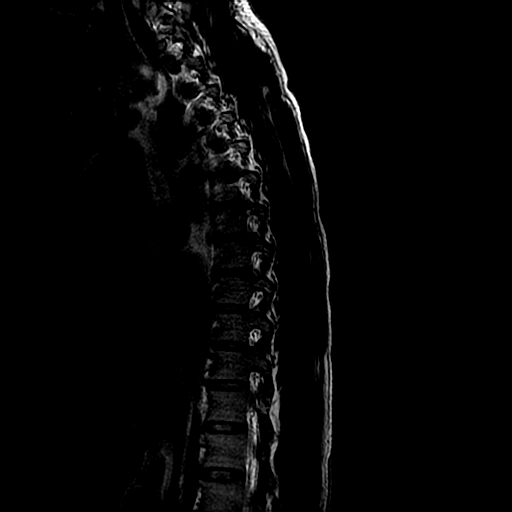
[im 16/16]
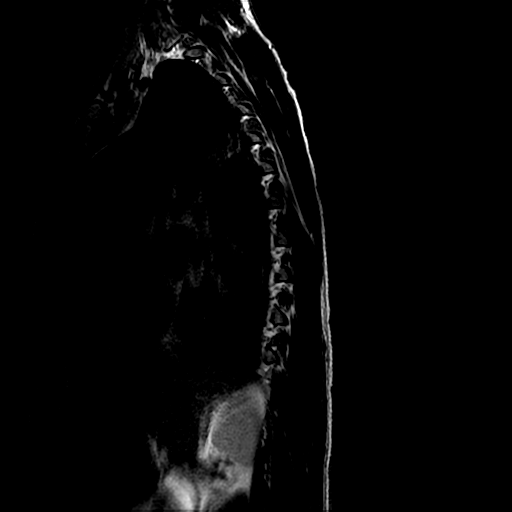

[Series 13: T1 · sagittal · 3.0mm · 0.62mm/px · 3 of 16 slices shown (1 of 2)]
[im 1/16]
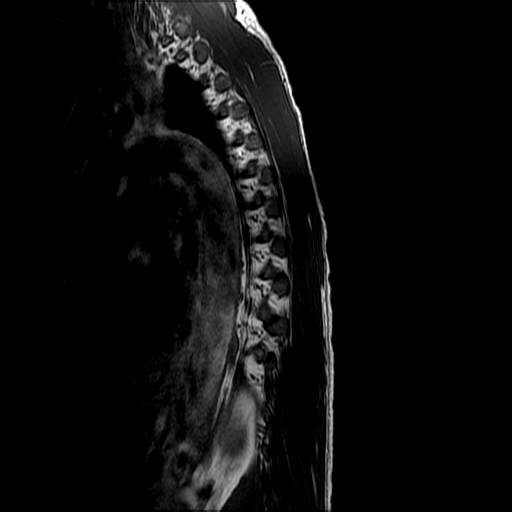
[im 8/16]
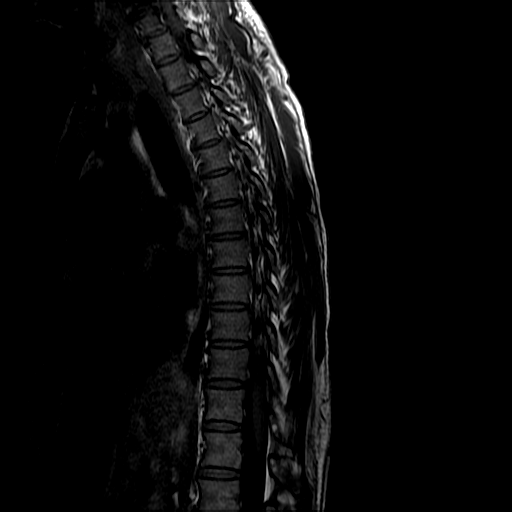
[im 16/16]
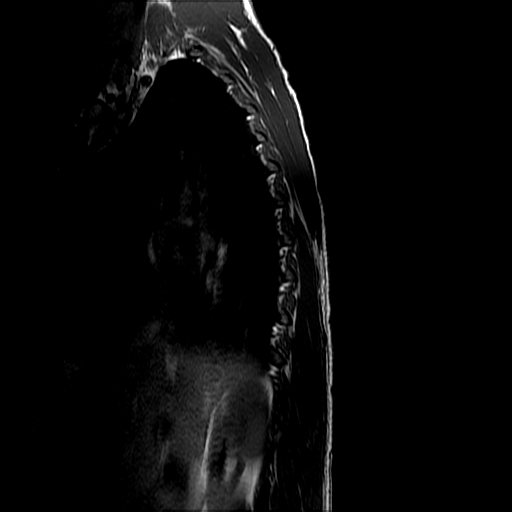

[Series 15: T2 · axial · 4.0mm · 0.43mm/px · z∈[-407,-190]mm · 8 of 39 slices shown (2 of 2)]
[im 1/39]
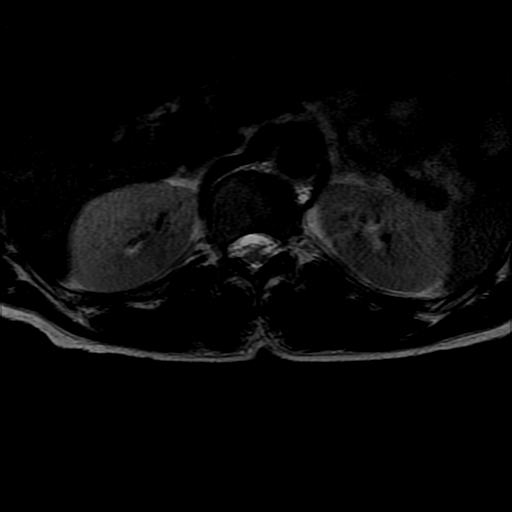
[im 6/39]
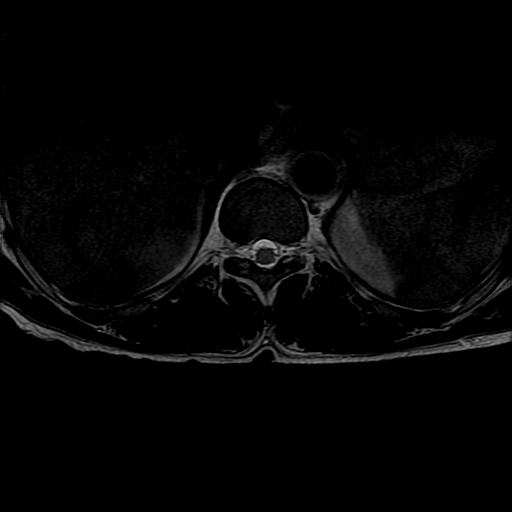
[im 11/39]
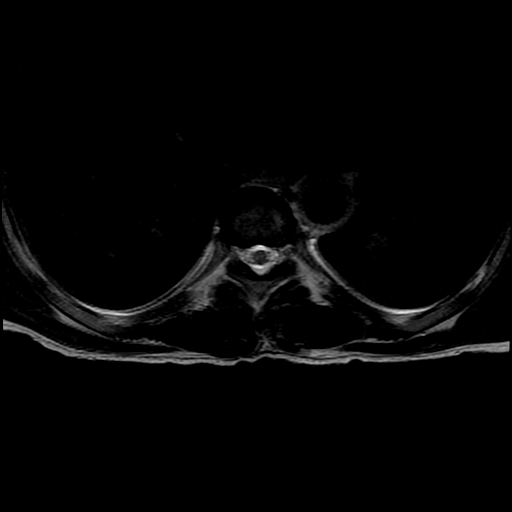
[im 17/39]
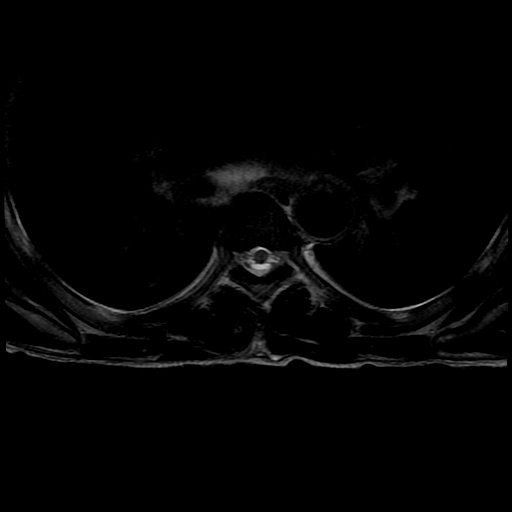
[im 22/39]
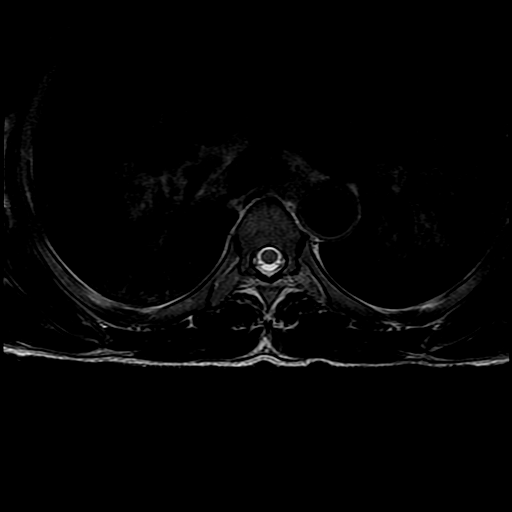
[im 28/39]
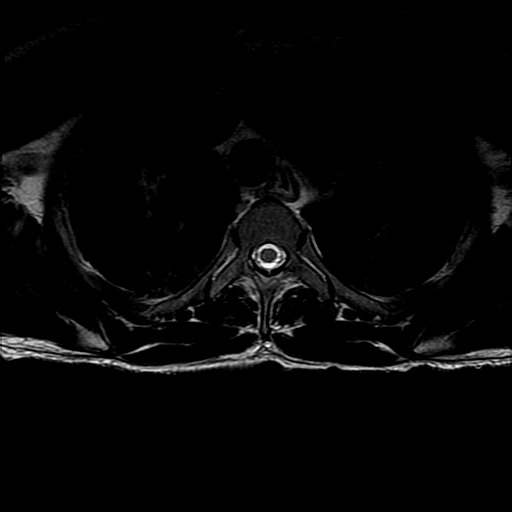
[im 33/39]
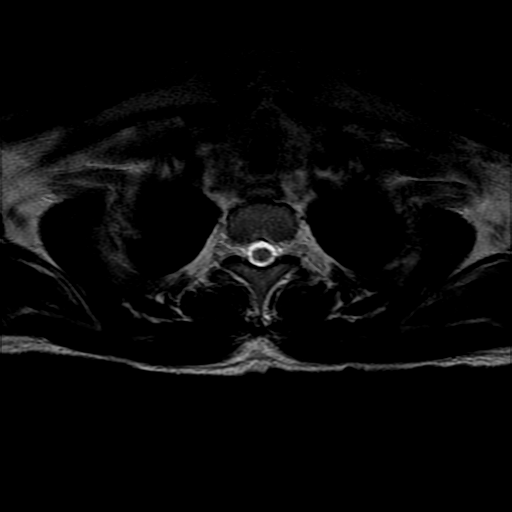
[im 39/39]
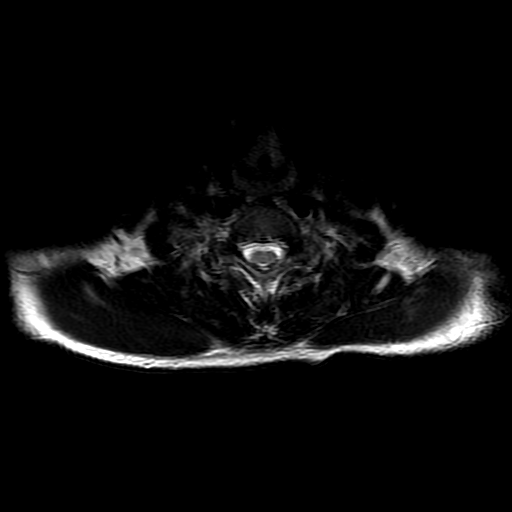

[Series 17: T1 · axial · non-contrast · 4.0mm · 0.43mm/px · z∈[-407,-222]mm · 4 of 39 slices shown (2 of 2)]
[im 1/39]
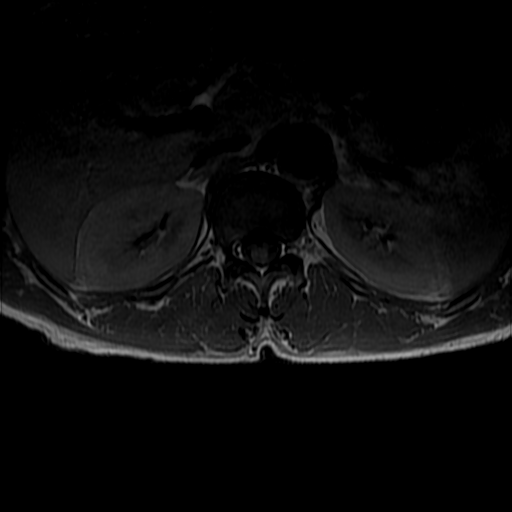
[im 6/39]
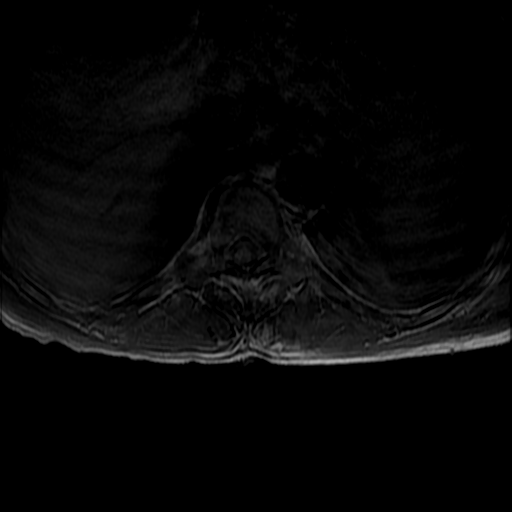
[im 22/39]
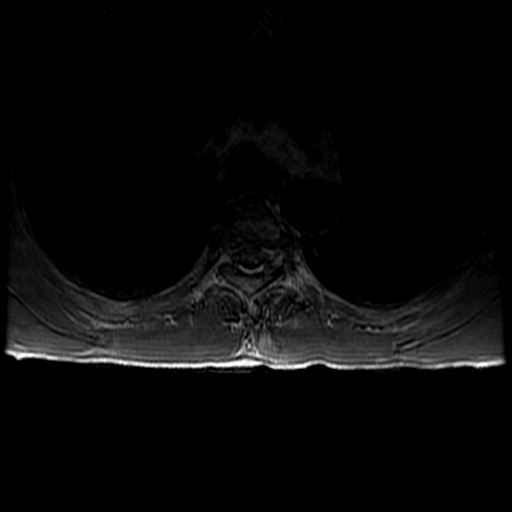
[im 33/39]
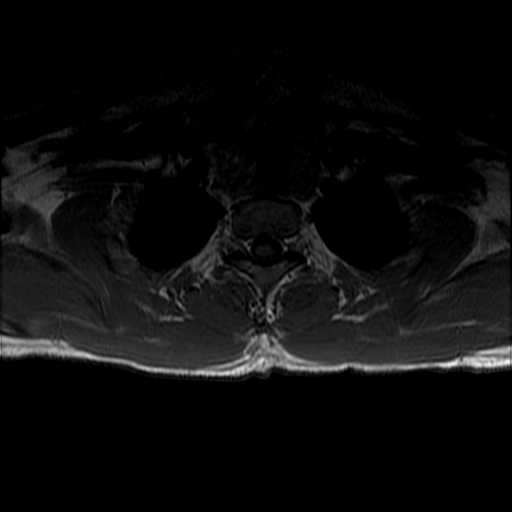

[19 of 48 positions shown; findings below may reference images not displayed]

FINDINGS: Motion limited study.  Within this limitation:

Alignment: Normal.

Vertebrae: Vertebral body heights are maintained. No focal marrow
edema to suggest acute fracture or discitis/osteomyelitis. No
suspicious bone lesions.

Cord: Motion limited evaluation without definite cord signal
abnormality or enhancement.

Paraspinal and other soft tissues: Mild opacities in lung
bilaterally.

Disc levels:

No significant disc protrusions, canal stenosis, or foraminal
stenosis. Mildly prominent dorsal epidural fat.
IMPRESSION: 1. Motion limited study without definite cord signal abnormality or
enhancement in the thoracic spine.
2. No significant stenosis in the thoracic spine.
3. Mild opacities in lung bilaterally, better characterized on
recent chest radiograph.

## 2021-10-05 IMAGING — CT CT HEAD W/O CM
4 series · 17 of 47 positions shown, 19 images · non-contrast
Comparison: [DATE]

CLINICAL DATA: Extremity numbness, neurodegenerative disorder
suspected

EXAM:
CT HEAD WITHOUT CONTRAST
TECHNIQUE: Contiguous axial images were obtained from the base of the skull
through the vertex without intravenous contrast.

[Series 3: head wo · axial · 0.39mm/px · z∈[-53,+52]mm · 7 of 29 slices shown, 9 images]
[im 4/29  brain]
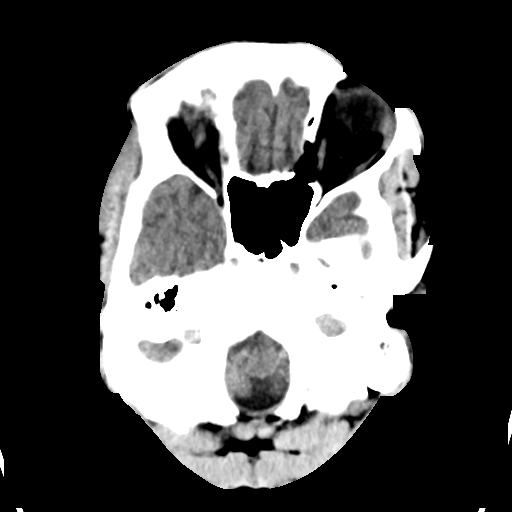
[im 4/29  bone]
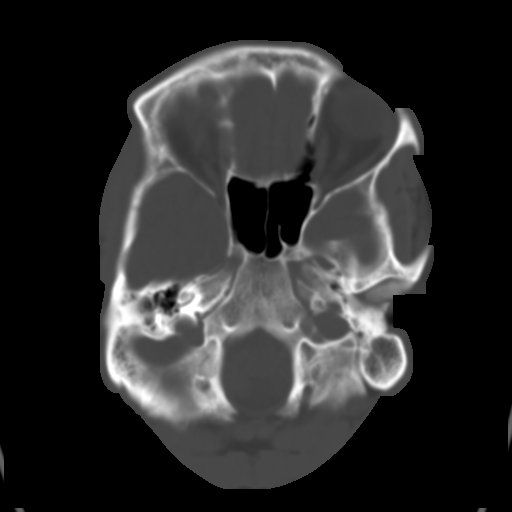
[im 8/29  brain]
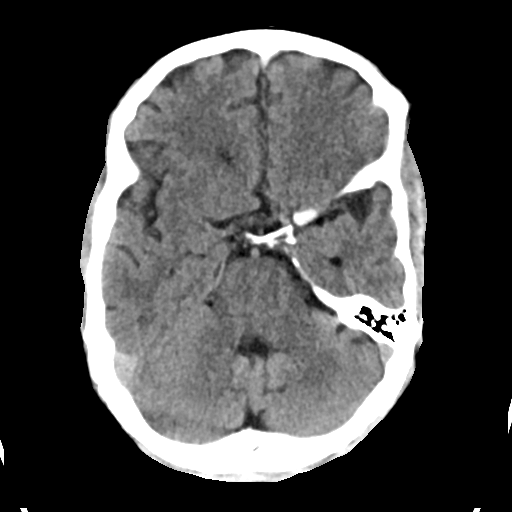
[im 11/29  brain]
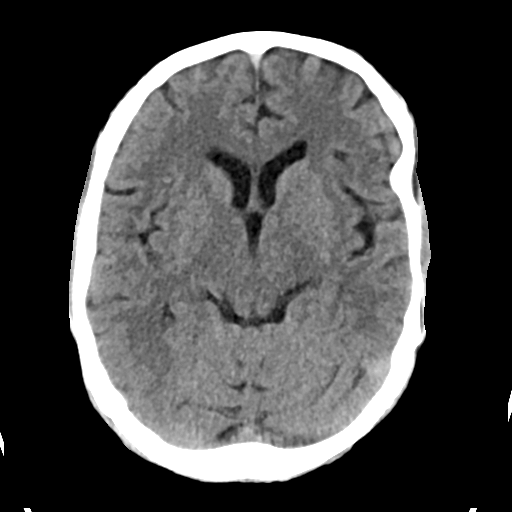
[im 15/29  brain]
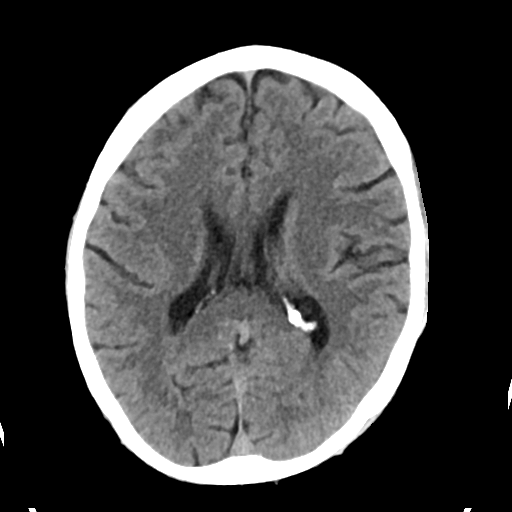
[im 18/29  brain]
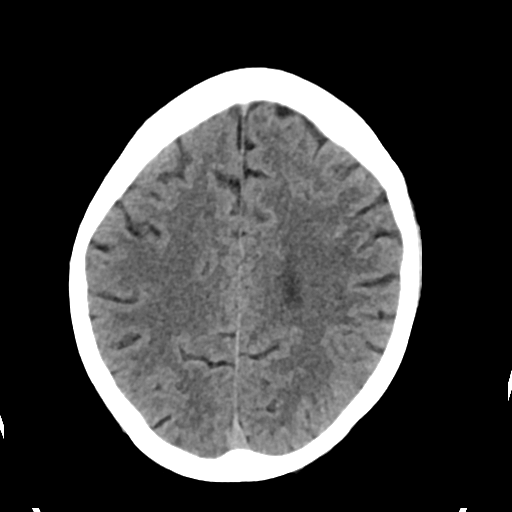
[im 18/29  bone]
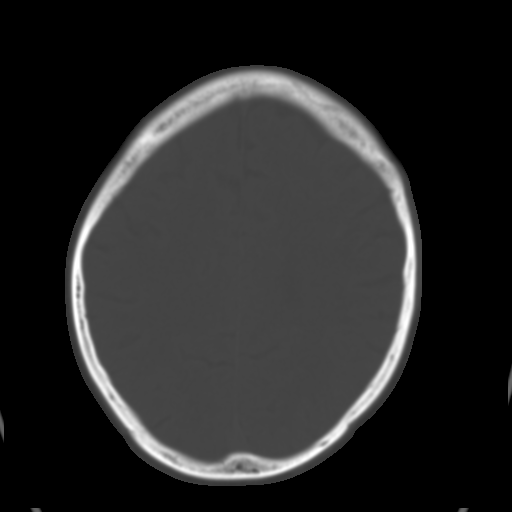
[im 22/29  brain]
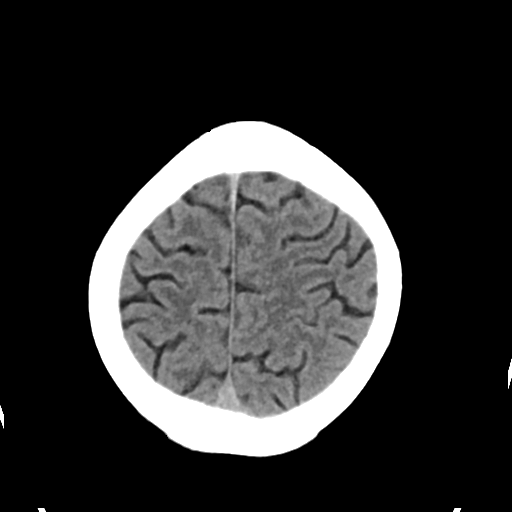
[im 25/29  brain]
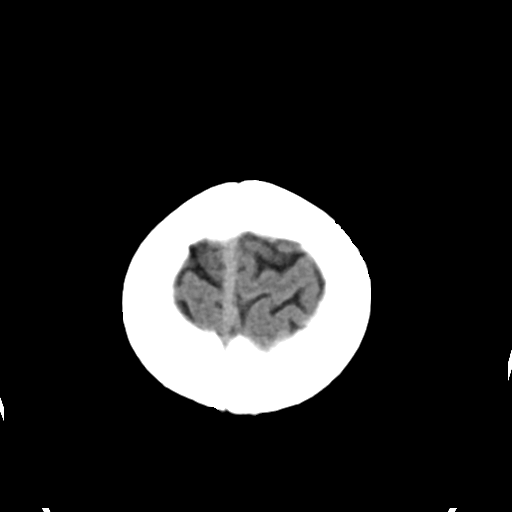

[Series 4: head bone · axial · 0.39mm/px · z∈[-54,-6]mm · 4 of 72 slices shown]
[im 8/72  bone]
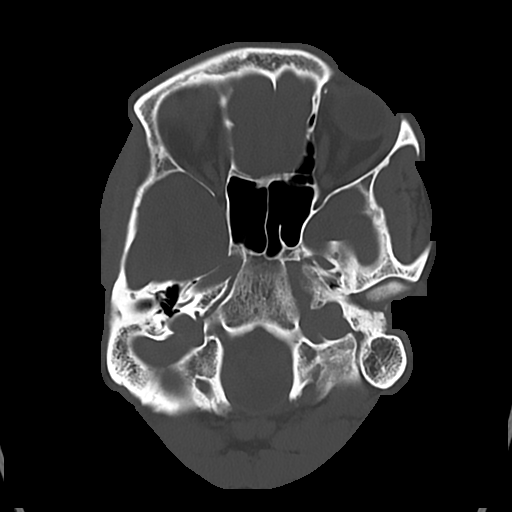
[im 15/72  bone]
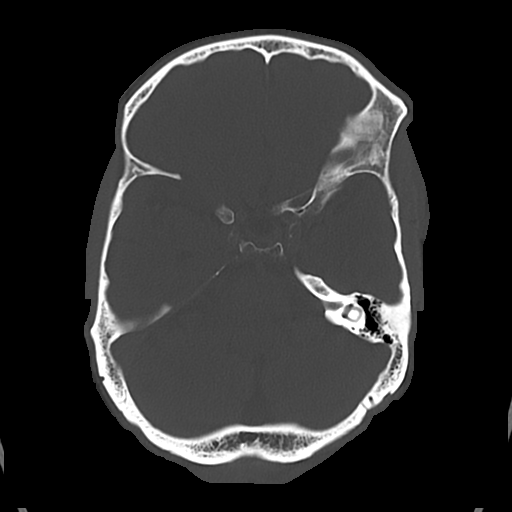
[im 22/72  bone]
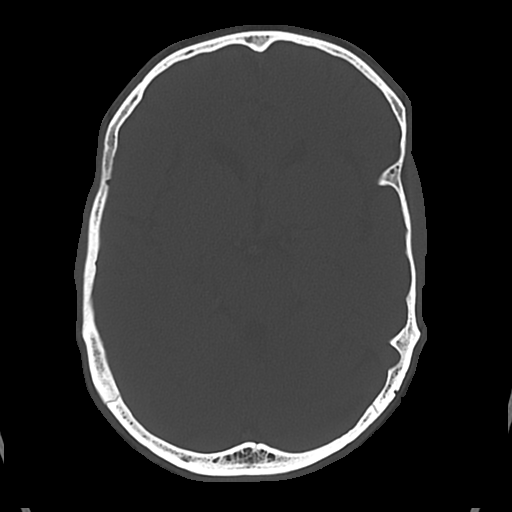
[im 32/72  bone]
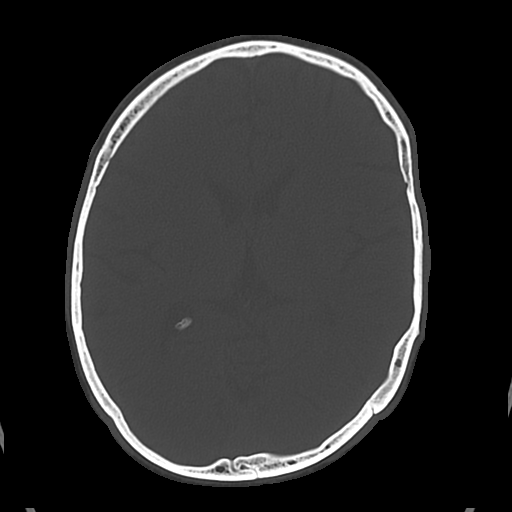

[Series 5: cor soft · coronal · 0.29mm/px · 3 of 72 slices shown]
[im 24/72  brain]
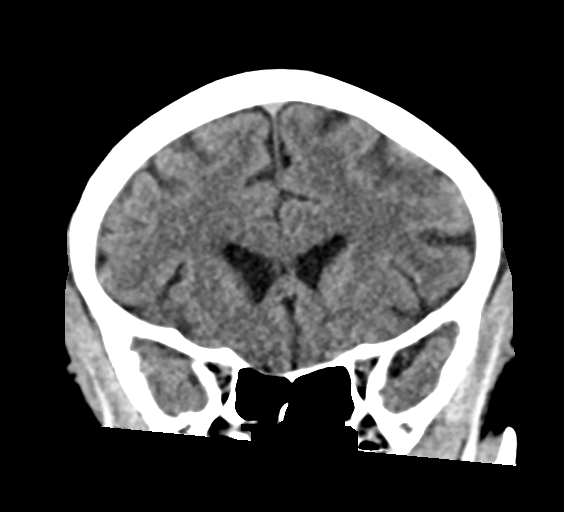
[im 32/72  brain]
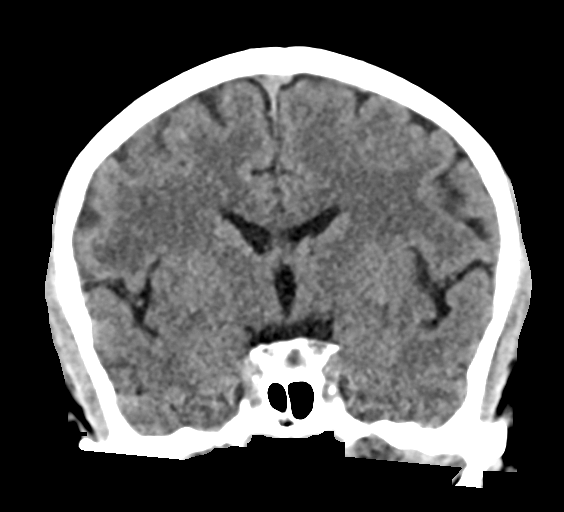
[im 40/72  brain]
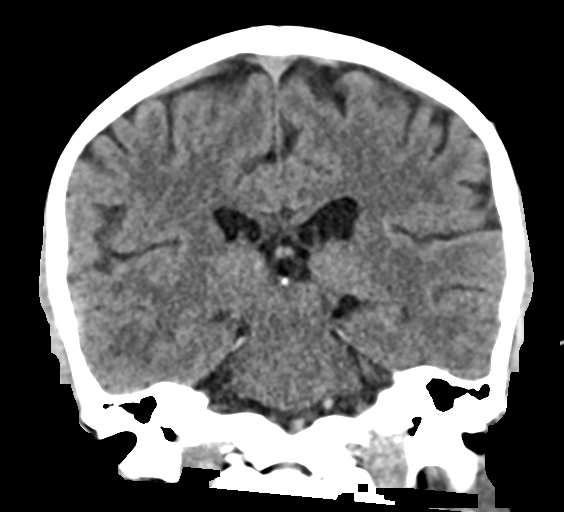

[Series 6: sag soft · sagittal · 0.32mm/px · 3 of 55 slices shown]
[im 19/55  brain]
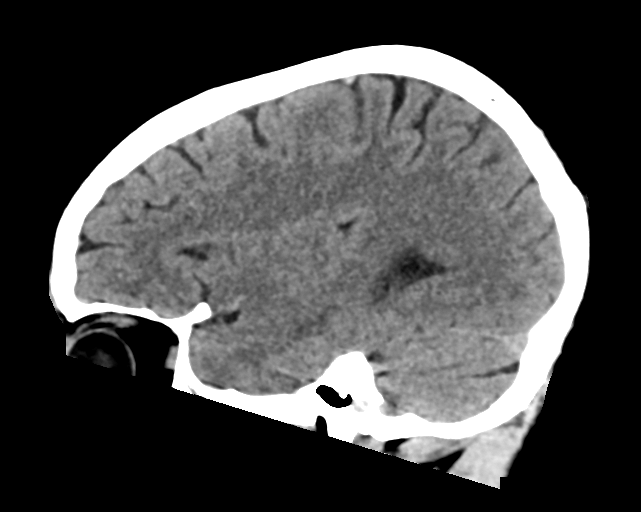
[im 28/55  brain]
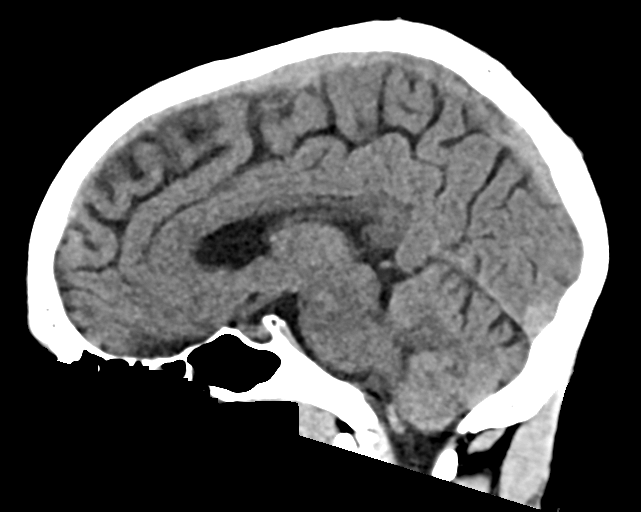
[im 37/55  brain]
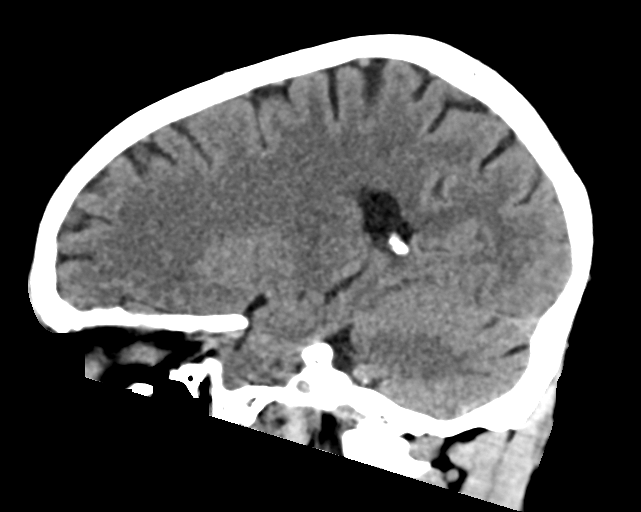

[17 of 47 positions shown; findings below may reference images not displayed]

FINDINGS: Brain: No evidence of acute infarction, hemorrhage, hydrocephalus,
extra-axial collection or mass lesion/mass effect.

Vascular: No hyperdense vessel or unexpected calcification.

Skull: Normal. Negative for fracture or focal lesion.

Sinuses/Orbits: No acute finding.

Other: None.
IMPRESSION: No acute intracranial pathology.

## 2021-10-05 IMAGING — MR MR HEAD WO/W CM
8 of 12 series · 32 of 48 positions shown · IV contrast (gadavist)
Comparison: None.

CLINICAL DATA: Progressively worsening numbness to hands

EXAM:
MRI HEAD WITHOUT AND WITH CONTRAST
MRI CERVICAL SPINE WITHOUT AND WITH CONTRAST
TECHNIQUE: Multiplanar, multiecho pulse sequences of the brain and surrounding
structures, and cervical spine, to include the craniocervical
junction and cervicothoracic junction, were obtained without and
with intravenous contrast.
CONTRAST:  5mL GADAVIST GADOBUTROL 1 MMOL/ML IV SOLN

[Series 3: T1 post-contrast · axial · 3.0mm · 0.47mm/px · z∈[-132,+8]mm · 4 of 50 slices shown (1 of 2)]
[im 1/50]
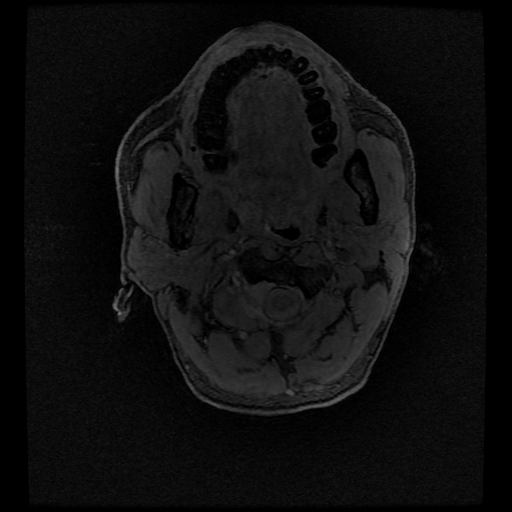
[im 17/50]
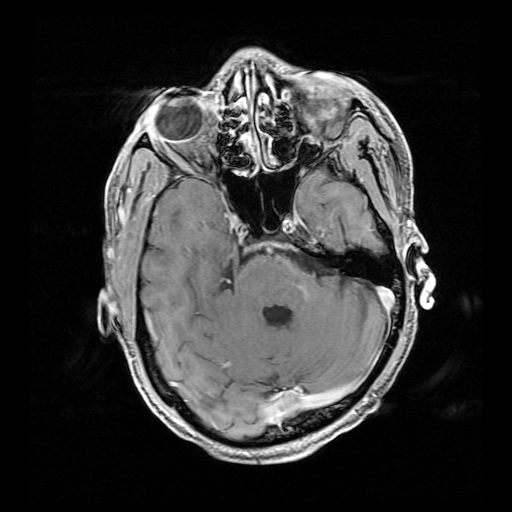
[im 33/50]
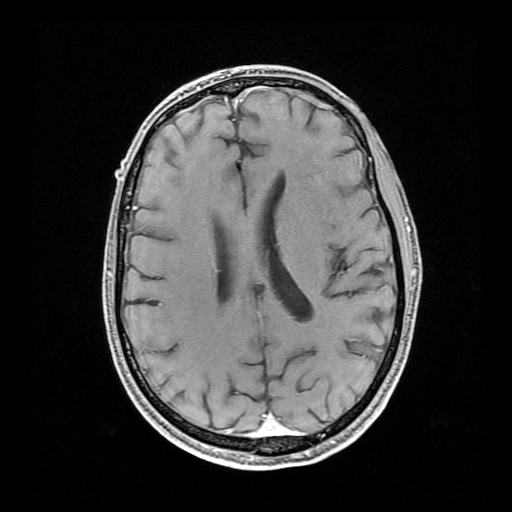
[im 50/50]
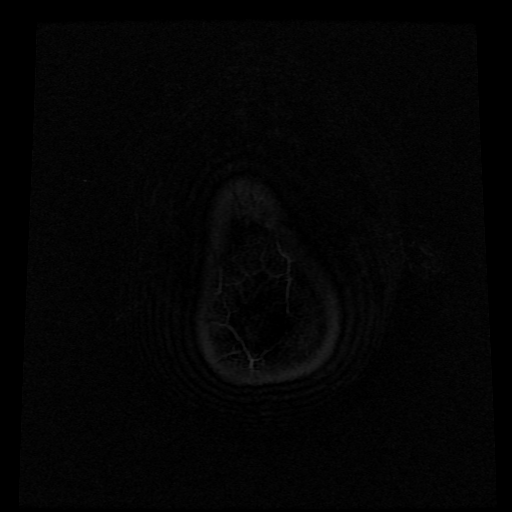

[Series 3: DWI · axial · 3.0mm · 1.09mm/px · z∈[-124,+12]mm · 8 of 96 slices shown (1 of 4)]
[im 1/96]
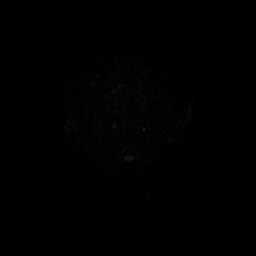
[im 14/96]
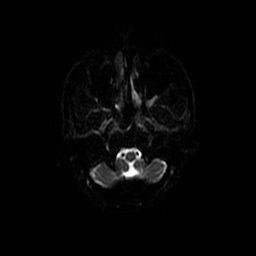
[im 28/96]
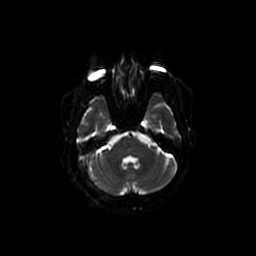
[im 41/96]
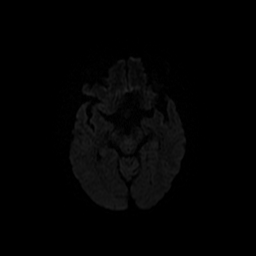
[im 55/96]
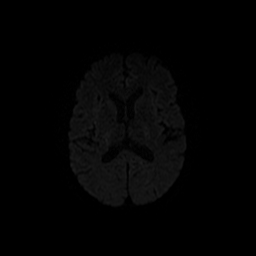
[im 68/96]
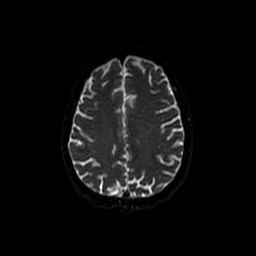
[im 82/96]
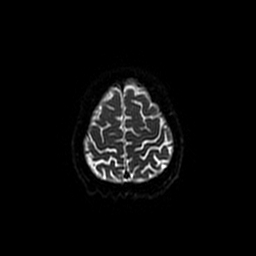
[im 96/96]
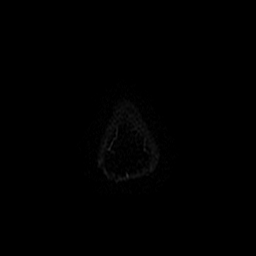

[Series 4: T2 post-contrast · coronal · 5.0mm · 0.39mm/px · 2 of 30 slices shown]
[im 1/30]
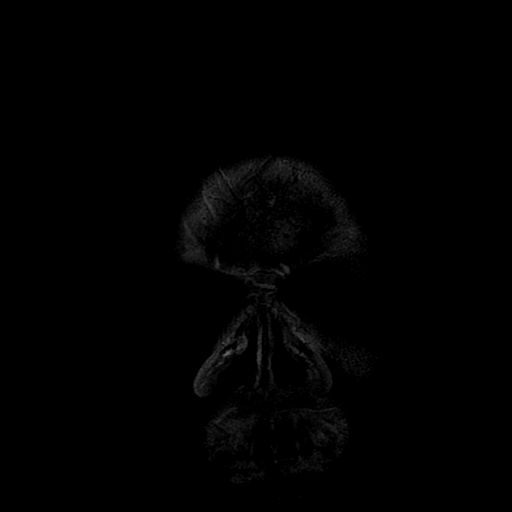
[im 15/30]
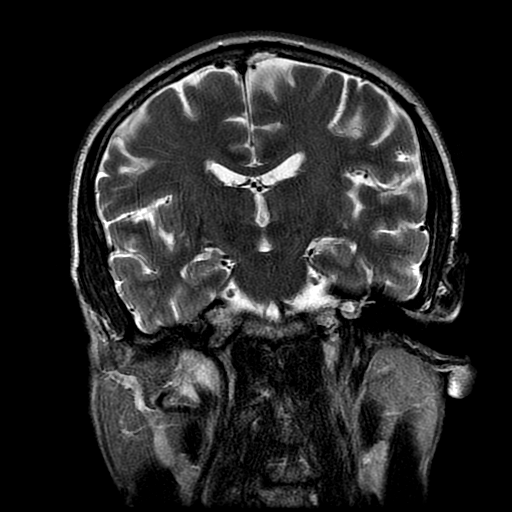

[Series 4: DWI · coronal · 5.0mm · 1.09mm/px · 6 of 76 slices shown (2 of 4)]
[im 1/76]
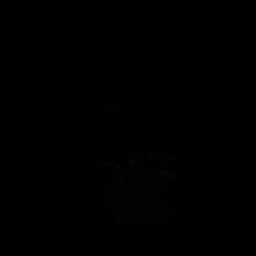
[im 16/76]
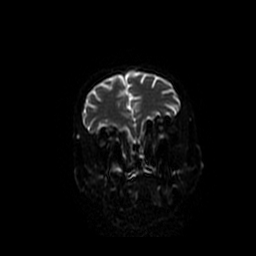
[im 31/76]
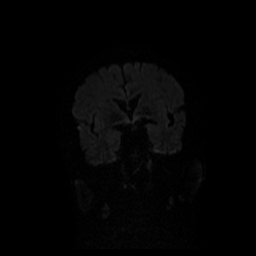
[im 46/76]
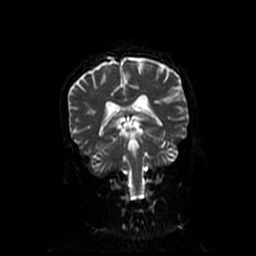
[im 61/76]
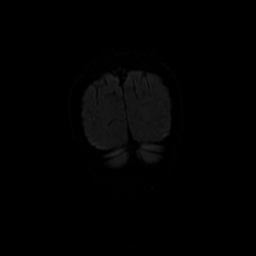
[im 76/76]
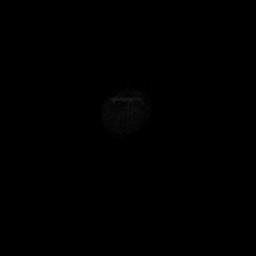

[Series 5: T1 post-contrast · coronal · 5.0mm · 0.45mm/px · 3 of 30 slices shown (2 of 2)]
[im 1/30]
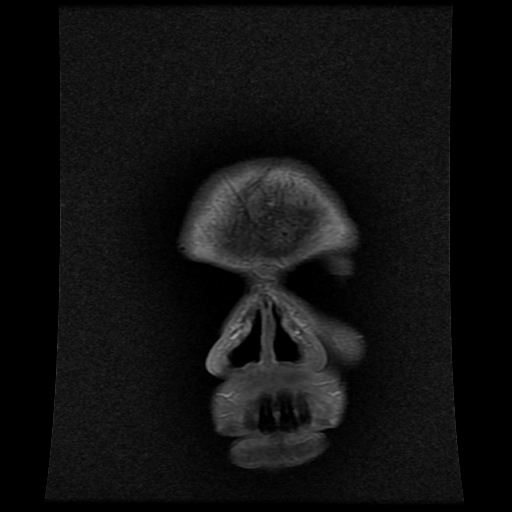
[im 15/30]
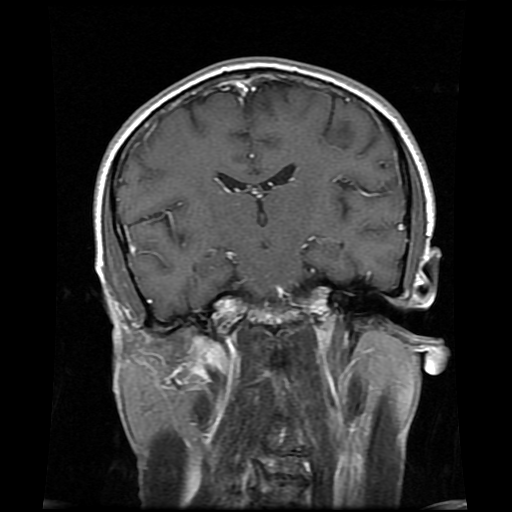
[im 30/30]
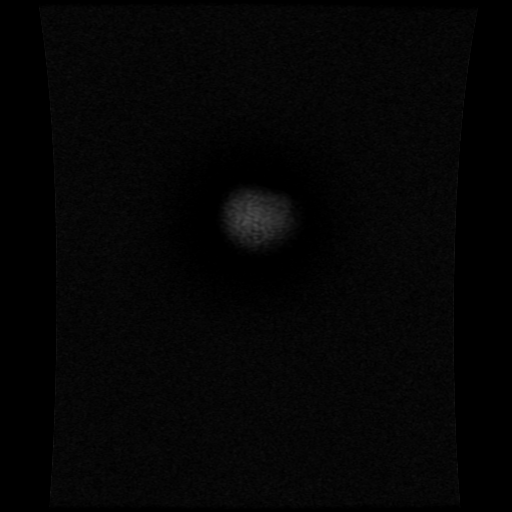

[Series 7: FLAIR · axial · 3.0mm · 0.47mm/px · z∈[-125,+13]mm · 2 of 25 slices shown]
[im 1/25]
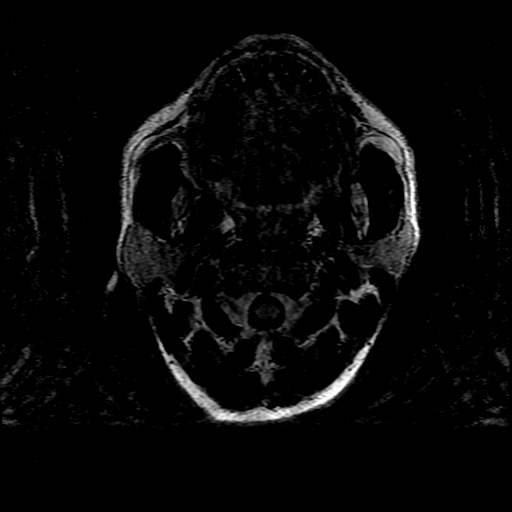
[im 25/25]
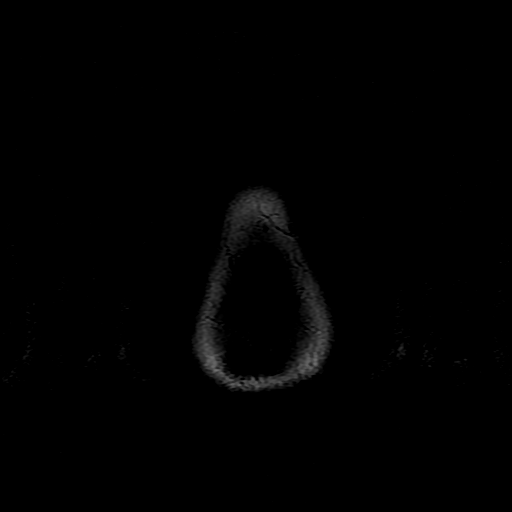

[Series 300: DWI · axial · 3.0mm · 1.09mm/px · z∈[-124,+12]mm · 4 of 48 slices shown (3 of 4)]
[im 1/48]
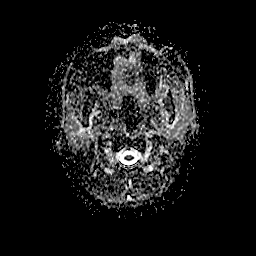
[im 16/48]
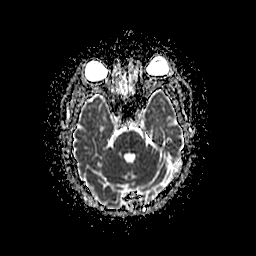
[im 32/48]
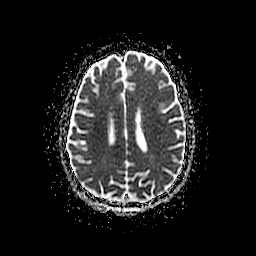
[im 48/48]
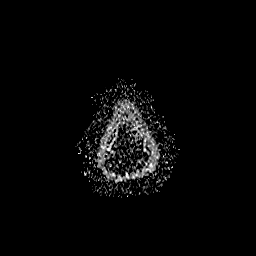

[Series 400: DWI · coronal · 5.0mm · 1.09mm/px · 3 of 37 slices shown (4 of 4)]
[im 1/37]
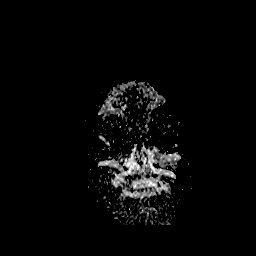
[im 19/37]
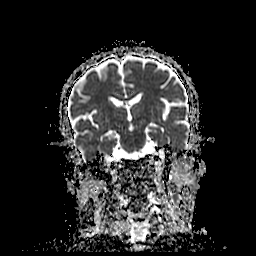
[im 37/37]
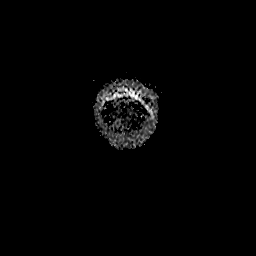

[32 of 48 positions shown; findings below may reference images not displayed]

FINDINGS: MRI HEAD FINDINGS

Brain: There is no acute infarction or intracranial hemorrhage.
There is no intracranial mass, mass effect, or edema. There is no
hydrocephalus or extra-axial fluid collection. Ventricles and sulci
are within normal limits in size and configuration. Scattered small
foci of T2 hyperintensity in the supratentorial white matter likely
reflect nonspecific gliosis/demyelination. No abnormal enhancement.

Vascular: Major vessel flow voids at the skull base are preserved.

Skull: Normal marrow signal is preserved.

Sinuses/Orbits: Mild to moderate mucosal thickening. Orbits are
unremarkable.

Other: Sella is unremarkable.  Mastoid air cells are clear.

MRI CERVICAL SPINE FINDINGS

Alignment: Mild degenerative listhesis.

Vertebrae: Multilevel degenerative endplate irregularity. Mild
marrow edema associated with right C5-C6 facets likely on a
degenerative basis.

Cord: Patchy abnormal cord T2 hyperintensity at the C4-C5 to C5-C6
levels.

Posterior Fossa, vertebral arteries, paraspinal tissues:
Unremarkable.

Disc levels:

C2-C3:  No canal or foraminal stenosis.

C3-C4: Disc bulge with endplate osteophytes. Uncovertebral
hypertrophy. Moderate canal stenosis. Moderate right and marked left
foraminal stenosis.

C4-C5: Disc bulge with endplate osteophytes. Uncovertebral
hypertrophy. Marked canal stenosis. Marked foraminal stenosis.

C5-C6: Disc bulge with endplate osteophytes. Uncovertebral
hypertrophy. Marked canal stenosis. Marked foraminal stenosis.

C6-C7: Disc bulge with endplate osteophytes. Uncovertebral
hypertrophy. Moderate canal stenosis. Marked foraminal stenosis.

C7-T1: Anterolisthesis uncovering of disc. Endplate osteophytes.
Facet hypertrophy. Mild canal stenosis. Moderate to marked right and
marked left foraminal stenosis.
IMPRESSION: Multilevel cervical spine degenerative changes as detailed above.
There is marked canal stenosis at C4-C5 and C5-C6. Abnormal cord T2
hyperintensity is present at this level likely reflecting edema.
There is also multilevel marked foraminal stenosis.

No evidence of recent infarction, hemorrhage, or mass. Minor burden
of nonspecific gliosis/demyelination in the cerebral white matter.

Preliminary cervical spine results were discussed by telephone at
the time of imaging completion on [DATE] with provider [HOSPITAL]
[facility] , who verbally acknowledged these results.

## 2021-10-05 MED ORDER — LIDOCAINE 5 % EX PTCH
1.0000 | MEDICATED_PATCH | CUTANEOUS | Status: DC
  Administered 2021-10-05 – 2021-10-06 (×2): 1 via TRANSDERMAL
  Filled 2021-10-05 (×2): qty 1

## 2021-10-05 MED ORDER — METHOCARBAMOL 500 MG PO TABS: 500.0000 mg | ORAL_TABLET | Freq: Four times a day (QID) | ORAL | Status: DC | PRN

## 2021-10-05 MED ORDER — MORPHINE SULFATE (PF) 2 MG/ML IV SOLN
2.0000 mg | INTRAVENOUS | Status: DC | PRN
  Administered 2021-10-06 (×4): 2 mg via INTRAVENOUS
  Filled 2021-10-05 (×4): qty 1

## 2021-10-05 MED ORDER — METHOCARBAMOL 1000 MG/10ML IJ SOLN
500.0000 mg | Freq: Four times a day (QID) | INTRAVENOUS | Status: DC | PRN
  Filled 2021-10-05: qty 5

## 2021-10-05 MED ORDER — ONDANSETRON HCL 4 MG PO TABS: 4.0000 mg | ORAL_TABLET | Freq: Four times a day (QID) | ORAL | Status: DC | PRN

## 2021-10-05 MED ORDER — DEXAMETHASONE SODIUM PHOSPHATE 4 MG/ML IJ SOLN
4.0000 mg | Freq: Four times a day (QID) | INTRAMUSCULAR | Status: DC
  Administered 2021-10-05 – 2021-10-06 (×4): 4 mg via INTRAVENOUS
  Filled 2021-10-05 (×4): qty 1

## 2021-10-05 MED ORDER — POTASSIUM CHLORIDE IN NACL 20-0.9 MEQ/L-% IV SOLN
INTRAVENOUS | Status: DC
  Filled 2021-10-05 (×2): qty 1000

## 2021-10-05 MED ORDER — SODIUM CHLORIDE 0.9% FLUSH: 3.0000 mL | INTRAVENOUS | Status: DC | PRN

## 2021-10-05 MED ORDER — PANTOPRAZOLE SODIUM 40 MG IV SOLR
40.0000 mg | Freq: Every day | INTRAVENOUS | Status: DC
  Administered 2021-10-05: 22:00:00 40 mg via INTRAVENOUS
  Filled 2021-10-05: qty 40

## 2021-10-05 MED ORDER — MORPHINE SULFATE (PF) 4 MG/ML IV SOLN
4.0000 mg | Freq: Once | INTRAVENOUS | Status: AC
  Administered 2021-10-05: 17:00:00 4 mg via INTRAVENOUS
  Filled 2021-10-05: qty 1

## 2021-10-05 MED ORDER — ACETAMINOPHEN 325 MG PO TABS: 650.0000 mg | ORAL_TABLET | ORAL | Status: DC | PRN

## 2021-10-05 MED ORDER — GABAPENTIN 300 MG PO CAPS
300.0000 mg | ORAL_CAPSULE | ORAL | Status: AC
  Administered 2021-10-06: 300 mg via ORAL
  Filled 2021-10-05: qty 1

## 2021-10-05 MED ORDER — ACETAMINOPHEN 500 MG PO TABS
1000.0000 mg | ORAL_TABLET | ORAL | Status: AC
  Administered 2021-10-06: 1000 mg via ORAL
  Filled 2021-10-05: qty 2

## 2021-10-05 MED ORDER — CEFAZOLIN SODIUM-DEXTROSE 2-4 GM/100ML-% IV SOLN
2.0000 g | INTRAVENOUS | Status: AC
  Administered 2021-10-06: 2 g via INTRAVENOUS
  Filled 2021-10-05: qty 100

## 2021-10-05 MED ORDER — ONDANSETRON HCL 4 MG/2ML IJ SOLN: 4.0000 mg | Freq: Four times a day (QID) | INTRAMUSCULAR | Status: DC | PRN

## 2021-10-05 MED ORDER — ACETAMINOPHEN 650 MG RE SUPP: 650.0000 mg | RECTAL | Status: DC | PRN

## 2021-10-05 MED ORDER — SENNA 8.6 MG PO TABS
1.0000 | ORAL_TABLET | Freq: Two times a day (BID) | ORAL | Status: DC
  Administered 2021-10-05: 22:00:00 8.6 mg via ORAL
  Filled 2021-10-05: qty 1

## 2021-10-05 MED ORDER — CHLORHEXIDINE GLUCONATE CLOTH 2 % EX PADS
6.0000 | MEDICATED_PAD | Freq: Once | CUTANEOUS | Status: AC
  Administered 2021-10-05: 6 via TOPICAL

## 2021-10-05 MED ORDER — SODIUM CHLORIDE 0.9 % IV SOLN: 250.0000 mL | INTRAVENOUS | Status: DC

## 2021-10-05 MED ORDER — CHLORHEXIDINE GLUCONATE CLOTH 2 % EX PADS: 6.0000 | MEDICATED_PAD | Freq: Once | CUTANEOUS | Status: DC

## 2021-10-05 MED ORDER — SODIUM CHLORIDE 0.9% FLUSH
3.0000 mL | Freq: Two times a day (BID) | INTRAVENOUS | Status: DC
  Administered 2021-10-06: 3 mL via INTRAVENOUS

## 2021-10-05 MED ORDER — DEXAMETHASONE 4 MG PO TABS
4.0000 mg | ORAL_TABLET | Freq: Four times a day (QID) | ORAL | Status: DC
  Filled 2021-10-05: qty 1

## 2021-10-05 MED ORDER — HYDROCODONE-ACETAMINOPHEN 5-325 MG PO TABS: 1.0000 | ORAL_TABLET | ORAL | Status: DC | PRN

## 2021-10-05 MED ORDER — GADOBUTROL 1 MMOL/ML IV SOLN
5.0000 mL | Freq: Once | INTRAVENOUS | Status: AC | PRN
  Administered 2021-10-05: 15:00:00 5 mL via INTRAVENOUS

## 2021-10-05 MED ORDER — KETOROLAC TROMETHAMINE 15 MG/ML IJ SOLN
15.0000 mg | Freq: Once | INTRAMUSCULAR | Status: AC
  Administered 2021-10-05: 12:00:00 15 mg via INTRAVENOUS
  Filled 2021-10-05: qty 1
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
## 2021-10-05 NOTE — ED Provider Notes (Signed)
°  Physical Exam  BP 112/76 (BP Location: Right Arm)    Pulse 66    Temp 99.1 F (37.3 C) (Oral)    Resp 17    Ht 4' 11.06" (1.5 m)    Wt 54 kg    SpO2 96%    BMI 24.00 kg/m   Physical Exam  ED Course/Procedures     Procedures  MDM  Patient care assumed at 3 pm. Patient here with worsening leg and arm weakness. MRI showed T2 cord edema. Dr. Yetta Barre from neurosurgery consulted. Sign out pending neurosurgery eval  5:44 PM Dr. Yetta Barre saw patient and will admit for surgery tomorrow      Charlynne Pander, MD 10/05/21 1744

## 2021-10-05 NOTE — Progress Notes (Signed)
Patient did NIF -38 and FVC 1.2 Liters with good effort.
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
## 2021-10-05 NOTE — ED Notes (Signed)
Pt off unit

## 2021-10-05 NOTE — ED Provider Notes (Signed)
Pulled back to triage for recheck and results reviewed. Flu A+. Reports progressively worsening numbness to hands and feet onset 3 weeks ago, worse since last night.  Interpreter used for additional history and exam- reports loss of sensation to bilateral mid forearm extends to hands/fingers. Also diminished sensation to dorsal feet, soles of feet insensate.  Unable to dc in triage at this time, will need further work up in main dept.    Jeannie Fend, PA-C 10/05/21 0849    Virgina Norfolk, DO 10/05/21 [phone]

## 2021-10-05 NOTE — Progress Notes (Signed)
Orthopedic Tech Progress Note Patient Details:  Marcus James 15-Oct-1966 [id number]  Ortho Devices Type of Ortho Device: Soft collar Ortho Device/Splint Location: NECK Ortho Device/Splint Interventions: Ordered, Application, Adjustment   Post Interventions Patient Tolerated: Well Instructions Provided: Adjustment of device, Care of device, Poper ambulation with device  [name] 10/05/2021, 10:13 PM

## 2021-10-06 ENCOUNTER — Encounter (HOSPITAL_COMMUNITY)
Admission: EM | Disposition: A | Discharge status: Home / Self Care | Payer: Self-pay | Source: Home / Self Care | Attending: Neurological Surgery

## 2021-10-06 ENCOUNTER — Encounter (HOSPITAL_COMMUNITY): Payer: Self-pay | Admitting: Neurological Surgery

## 2021-10-06 ENCOUNTER — Inpatient Hospital Stay (HOSPITAL_COMMUNITY): Payer: Medicaid Other | Admitting: Certified Registered Nurse Anesthetist

## 2021-10-06 ENCOUNTER — Inpatient Hospital Stay (HOSPITAL_COMMUNITY): Payer: Medicaid Other

## 2021-10-06 DIAGNOSIS — Z981 Arthrodesis status: Secondary | ICD-10-CM

## 2021-10-06 HISTORY — PX: POSTERIOR CERVICAL FUSION/FORAMINOTOMY: SHX5038

## 2021-10-06 LAB — TYPE AND SCREEN
ABO/RH(D): A POS
Antibody Screen: NEGATIVE

## 2021-10-06 LAB — SURGICAL PCR SCREEN
MRSA, PCR: NEGATIVE
Staphylococcus aureus: NEGATIVE

## 2021-10-06 LAB — PROTIME-INR
INR: 1.1 (ref 0.8–1.2)
Prothrombin Time: 13.7 seconds (ref 11.4–15.2)

## 2021-10-06 LAB — ABO/RH: ABO/RH(D): A POS

## 2021-10-06 IMAGING — RF DG CERVICAL SPINE 2 OR 3 VIEWS
1 series · 2 of 2 positions shown · non-contrast
Comparison: MRI [DATE]

CLINICAL DATA: Cervical decompression

EXAM:
CERVICAL SPINE - 2-3 VIEW

[Series 1: run · 2 of 2 slices shown]
[im 1/2]
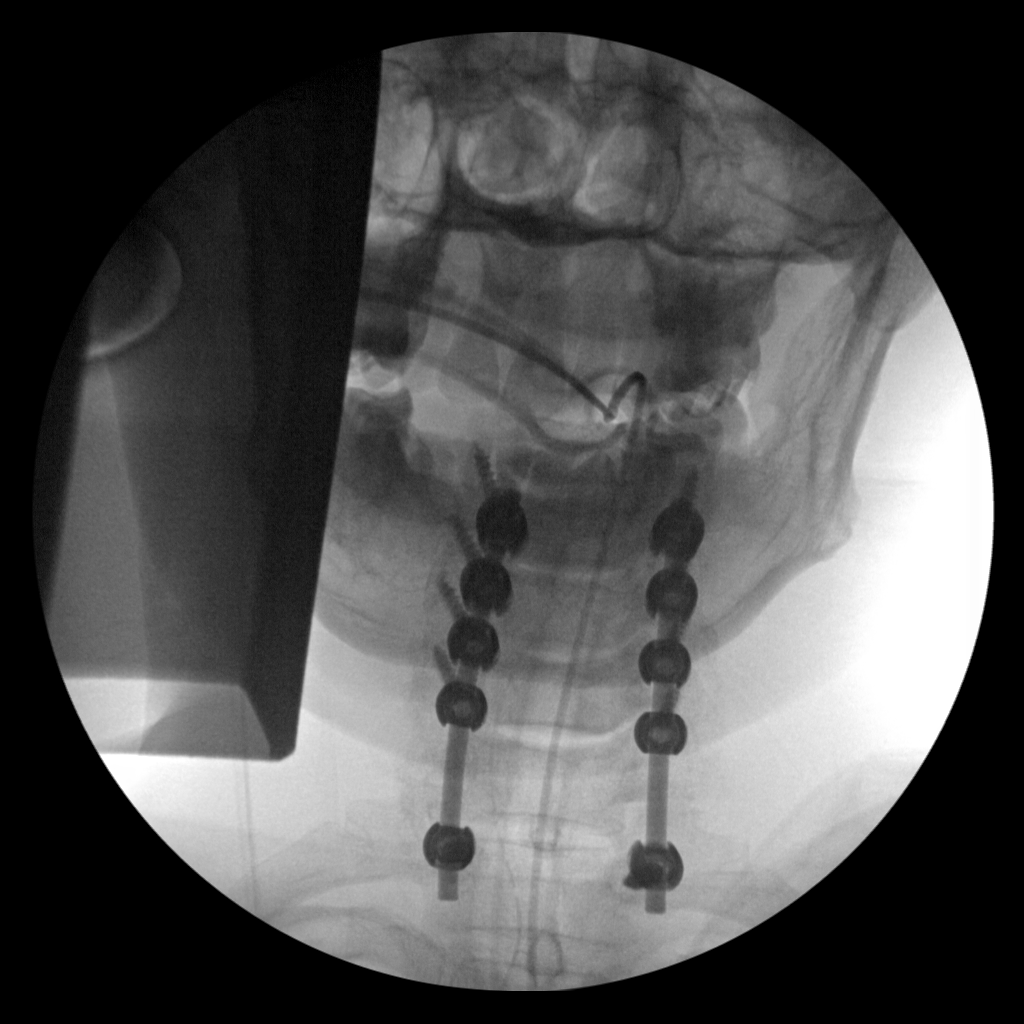
[im 2/2]
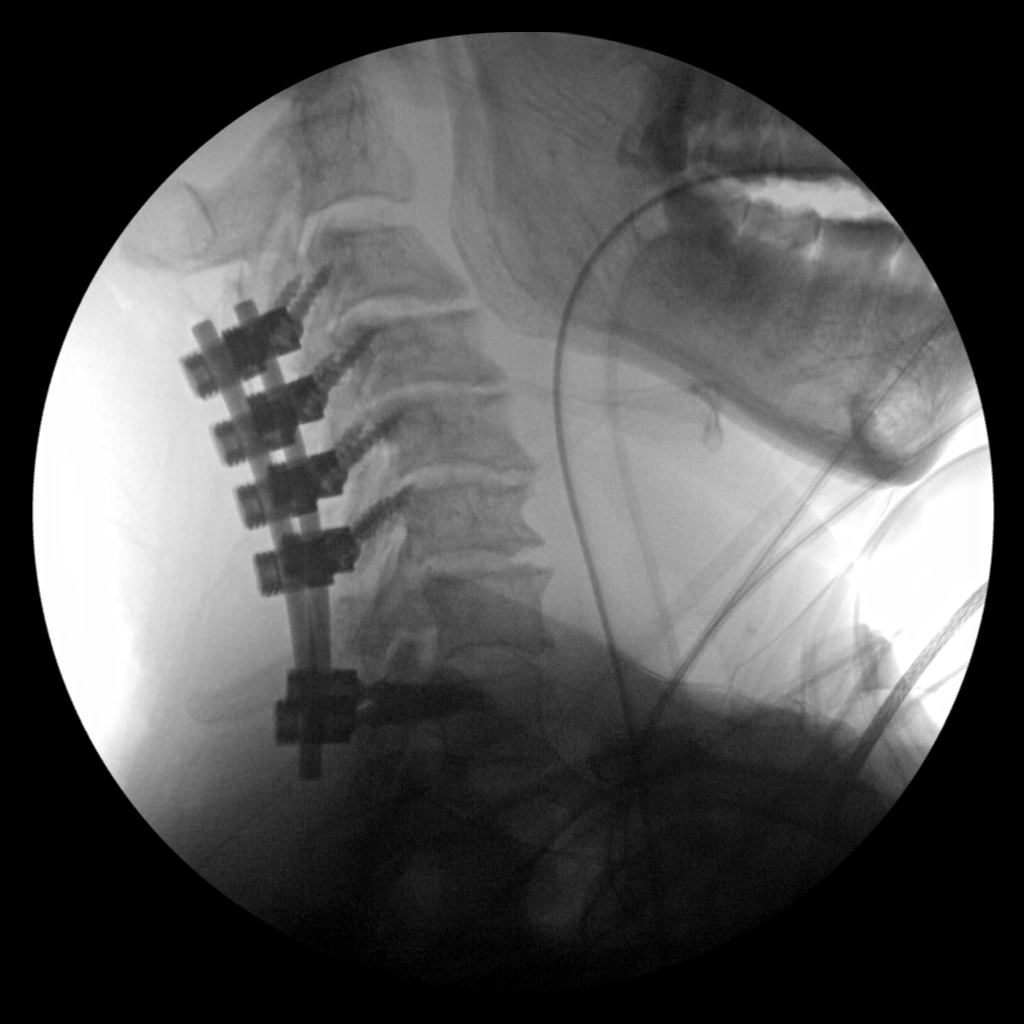

[2 of 2 positions shown; findings below may reference images not displayed]

FINDINGS: Two low resolution intraoperative spot views of the cervical spine.
Total fluoroscopy time was 56 seconds. Images demonstrate posterior
rod and fixating screws C3 through T1.
IMPRESSION: Intraoperative fluoroscopic assistance provided during cervical
spine surgery

## 2021-10-06 SURGERY — POSTERIOR CERVICAL FUSION/FORAMINOTOMY LEVEL 5
Anesthesia: General | Site: Spine Cervical

## 2021-10-06 MED ORDER — LIDOCAINE 2% (20 MG/ML) 5 ML SYRINGE
INTRAMUSCULAR | Status: AC
  Filled 2021-10-06: qty 5

## 2021-10-06 MED ORDER — ROCURONIUM BROMIDE 10 MG/ML (PF) SYRINGE
PREFILLED_SYRINGE | INTRAVENOUS | Status: AC
  Filled 2021-10-06: qty 10

## 2021-10-06 MED ORDER — BACITRACIN ZINC 500 UNIT/GM EX OINT
TOPICAL_OINTMENT | CUTANEOUS | Status: AC
  Filled 2021-10-06: qty 28.35

## 2021-10-06 MED ORDER — SUGAMMADEX SODIUM 200 MG/2ML IV SOLN
INTRAVENOUS | Status: DC | PRN
  Administered 2021-10-06: 200 mg via INTRAVENOUS

## 2021-10-06 MED ORDER — VANCOMYCIN HCL 1000 MG IV SOLR
INTRAVENOUS | Status: AC
  Filled 2021-10-06: qty 20

## 2021-10-06 MED ORDER — LABETALOL HCL 5 MG/ML IV SOLN: 5.0000 mg | INTRAVENOUS | Status: DC | PRN

## 2021-10-06 MED ORDER — THROMBIN 20000 UNITS EX SOLR
CUTANEOUS | Status: AC
  Filled 2021-10-06: qty 20000

## 2021-10-06 MED ORDER — THROMBIN 5000 UNITS EX SOLR: OROMUCOSAL | Status: DC | PRN

## 2021-10-06 MED ORDER — MORPHINE SULFATE (PF) 2 MG/ML IV SOLN
2.0000 mg | INTRAVENOUS | Status: DC | PRN
  Administered 2021-10-07 – 2021-10-08 (×2): 2 mg via INTRAVENOUS
  Filled 2021-10-06 (×2): qty 1

## 2021-10-06 MED ORDER — PHENYLEPHRINE 40 MCG/ML (10ML) SYRINGE FOR IV PUSH (FOR BLOOD PRESSURE SUPPORT)
PREFILLED_SYRINGE | INTRAVENOUS | Status: AC
  Filled 2021-10-06: qty 10

## 2021-10-06 MED ORDER — SUCCINYLCHOLINE CHLORIDE 200 MG/10ML IV SOSY
PREFILLED_SYRINGE | INTRAVENOUS | Status: DC | PRN
  Administered 2021-10-06: 160 mg via INTRAVENOUS

## 2021-10-06 MED ORDER — SUCCINYLCHOLINE CHLORIDE 200 MG/10ML IV SOSY
PREFILLED_SYRINGE | INTRAVENOUS | Status: AC
  Filled 2021-10-06: qty 10

## 2021-10-06 MED ORDER — SODIUM CHLORIDE 0.9 % IV SOLN
250.0000 mL | INTRAVENOUS | Status: DC
  Administered 2021-10-06: 250 mL via INTRAVENOUS

## 2021-10-06 MED ORDER — CHLORHEXIDINE GLUCONATE 0.12 % MT SOLN
OROMUCOSAL | Status: AC
  Administered 2021-10-06: 15 mL via OROMUCOSAL
  Filled 2021-10-06: qty 15

## 2021-10-06 MED ORDER — FENTANYL CITRATE (PF) 100 MCG/2ML IJ SOLN
INTRAMUSCULAR | Status: AC
  Filled 2021-10-06: qty 2

## 2021-10-06 MED ORDER — BUPIVACAINE HCL (PF) 0.25 % IJ SOLN
INTRAMUSCULAR | Status: AC
  Filled 2021-10-06: qty 30

## 2021-10-06 MED ORDER — HYDROMORPHONE HCL 1 MG/ML IJ SOLN
INTRAMUSCULAR | Status: AC
  Filled 2021-10-06: qty 0.5

## 2021-10-06 MED ORDER — ONDANSETRON HCL 4 MG PO TABS: 4.0000 mg | ORAL_TABLET | Freq: Four times a day (QID) | ORAL | Status: DC | PRN

## 2021-10-06 MED ORDER — CEFAZOLIN SODIUM-DEXTROSE 2-4 GM/100ML-% IV SOLN
2.0000 g | Freq: Three times a day (TID) | INTRAVENOUS | Status: AC
  Administered 2021-10-06 – 2021-10-07 (×2): 2 g via INTRAVENOUS
  Filled 2021-10-06 (×2): qty 100

## 2021-10-06 MED ORDER — SENNA 8.6 MG PO TABS
1.0000 | ORAL_TABLET | Freq: Two times a day (BID) | ORAL | Status: DC
  Administered 2021-10-06 – 2021-10-09 (×6): 8.6 mg via ORAL
  Filled 2021-10-06 (×6): qty 1

## 2021-10-06 MED ORDER — LACTATED RINGERS IV SOLN: INTRAVENOUS | Status: DC | PRN

## 2021-10-06 MED ORDER — PROPOFOL 10 MG/ML IV BOLUS
INTRAVENOUS | Status: AC
  Filled 2021-10-06: qty 20

## 2021-10-06 MED ORDER — HYDROMORPHONE HCL 1 MG/ML IJ SOLN
INTRAMUSCULAR | Status: DC | PRN
  Administered 2021-10-06: .5 mg via INTRAVENOUS

## 2021-10-06 MED ORDER — PROPOFOL 10 MG/ML IV BOLUS
INTRAVENOUS | Status: DC | PRN
  Administered 2021-10-06: 100 mg via INTRAVENOUS
  Administered 2021-10-06 (×2): 50 mg via INTRAVENOUS

## 2021-10-06 MED ORDER — FENTANYL CITRATE (PF) 250 MCG/5ML IJ SOLN
INTRAMUSCULAR | Status: AC
  Filled 2021-10-06: qty 5

## 2021-10-06 MED ORDER — METHOCARBAMOL 500 MG PO TABS
500.0000 mg | ORAL_TABLET | Freq: Four times a day (QID) | ORAL | Status: DC | PRN
  Administered 2021-10-07 – 2021-10-08 (×2): 500 mg via ORAL
  Filled 2021-10-06 (×2): qty 1

## 2021-10-06 MED ORDER — SODIUM CHLORIDE 0.9% FLUSH: 3.0000 mL | INTRAVENOUS | Status: DC | PRN

## 2021-10-06 MED ORDER — ONDANSETRON HCL 4 MG/2ML IJ SOLN: 4.0000 mg | Freq: Four times a day (QID) | INTRAMUSCULAR | Status: DC | PRN

## 2021-10-06 MED ORDER — OXYCODONE HCL 5 MG PO TABS
5.0000 mg | ORAL_TABLET | ORAL | Status: DC | PRN
  Administered 2021-10-06 – 2021-10-08 (×8): 5 mg via ORAL
  Filled 2021-10-06 (×8): qty 1

## 2021-10-06 MED ORDER — 0.9 % SODIUM CHLORIDE (POUR BTL) OPTIME
TOPICAL | Status: DC | PRN
  Administered 2021-10-06: 1000 mL

## 2021-10-06 MED ORDER — BUPIVACAINE HCL (PF) 0.25 % IJ SOLN
INTRAMUSCULAR | Status: DC | PRN
  Administered 2021-10-06: 5 mL

## 2021-10-06 MED ORDER — MIDAZOLAM HCL 2 MG/2ML IJ SOLN
INTRAMUSCULAR | Status: DC | PRN
  Administered 2021-10-06: 2 mg via INTRAVENOUS

## 2021-10-06 MED ORDER — MENTHOL 3 MG MT LOZG
1.0000 | LOZENGE | OROMUCOSAL | Status: DC | PRN
  Filled 2021-10-06: qty 9

## 2021-10-06 MED ORDER — MIDAZOLAM HCL 2 MG/2ML IJ SOLN
INTRAMUSCULAR | Status: AC
  Filled 2021-10-06: qty 2

## 2021-10-06 MED ORDER — PHENYLEPHRINE HCL (PRESSORS) 10 MG/ML IV SOLN
INTRAVENOUS | Status: DC | PRN
  Administered 2021-10-06: 160 ug via INTRAVENOUS

## 2021-10-06 MED ORDER — SODIUM CHLORIDE 0.9% FLUSH
3.0000 mL | Freq: Two times a day (BID) | INTRAVENOUS | Status: DC
  Administered 2021-10-06 – 2021-10-09 (×5): 3 mL via INTRAVENOUS

## 2021-10-06 MED ORDER — THROMBIN 20000 UNITS EX SOLR: CUTANEOUS | Status: DC | PRN

## 2021-10-06 MED ORDER — DEXAMETHASONE SODIUM PHOSPHATE 4 MG/ML IJ SOLN
4.0000 mg | Freq: Four times a day (QID) | INTRAMUSCULAR | Status: DC
  Administered 2021-10-06 – 2021-10-08 (×4): 4 mg via INTRAVENOUS
  Filled 2021-10-06 (×6): qty 1

## 2021-10-06 MED ORDER — PHENOL 1.4 % MT LIQD: 1.0000 | OROMUCOSAL | Status: DC | PRN

## 2021-10-06 MED ORDER — GLYCOPYRROLATE PF 0.2 MG/ML IJ SOSY
PREFILLED_SYRINGE | INTRAMUSCULAR | Status: DC | PRN
  Administered 2021-10-06: .2 mg via INTRAVENOUS

## 2021-10-06 MED ORDER — POTASSIUM CHLORIDE IN NACL 20-0.9 MEQ/L-% IV SOLN
INTRAVENOUS | Status: DC
  Filled 2021-10-06: qty 1000

## 2021-10-06 MED ORDER — LIDOCAINE 2% (20 MG/ML) 5 ML SYRINGE
INTRAMUSCULAR | Status: DC | PRN
  Administered 2021-10-06: 60 mg via INTRAVENOUS

## 2021-10-06 MED ORDER — ROCURONIUM BROMIDE 10 MG/ML (PF) SYRINGE
PREFILLED_SYRINGE | INTRAVENOUS | Status: DC | PRN
  Administered 2021-10-06: 40 mg via INTRAVENOUS
  Administered 2021-10-06 (×2): 30 mg via INTRAVENOUS

## 2021-10-06 MED ORDER — DEXAMETHASONE 4 MG PO TABS
4.0000 mg | ORAL_TABLET | Freq: Four times a day (QID) | ORAL | Status: DC
  Administered 2021-10-07 – 2021-10-08 (×4): 4 mg via ORAL
  Filled 2021-10-06 (×3): qty 1

## 2021-10-06 MED ORDER — ACETAMINOPHEN 500 MG PO TABS
1000.0000 mg | ORAL_TABLET | Freq: Four times a day (QID) | ORAL | Status: AC
  Administered 2021-10-06 – 2021-10-07 (×4): 1000 mg via ORAL
  Filled 2021-10-06 (×4): qty 2

## 2021-10-06 MED ORDER — THROMBIN 5000 UNITS EX SOLR
CUTANEOUS | Status: AC
  Filled 2021-10-06: qty 5000

## 2021-10-06 MED ORDER — FENTANYL CITRATE (PF) 100 MCG/2ML IJ SOLN
25.0000 ug | INTRAMUSCULAR | Status: DC | PRN
  Administered 2021-10-06 (×3): 50 ug via INTRAVENOUS

## 2021-10-06 MED ORDER — ONDANSETRON HCL 4 MG/2ML IJ SOLN
INTRAMUSCULAR | Status: DC | PRN
  Administered 2021-10-06: 4 mg via INTRAVENOUS

## 2021-10-06 MED ORDER — PHENYLEPHRINE HCL-NACL 20-0.9 MG/250ML-% IV SOLN
INTRAVENOUS | Status: DC | PRN
  Administered 2021-10-06: 100 ug/min via INTRAVENOUS

## 2021-10-06 MED ORDER — LACTATED RINGERS IV SOLN: INTRAVENOUS | Status: DC

## 2021-10-06 MED ORDER — FENTANYL CITRATE (PF) 250 MCG/5ML IJ SOLN
INTRAMUSCULAR | Status: DC | PRN
  Administered 2021-10-06 (×2): 50 ug via INTRAVENOUS
  Administered 2021-10-06: 150 ug via INTRAVENOUS
  Administered 2021-10-06: 50 ug via INTRAVENOUS

## 2021-10-06 MED ORDER — GLYCOPYRROLATE PF 0.2 MG/ML IJ SOSY
PREFILLED_SYRINGE | INTRAMUSCULAR | Status: AC
  Filled 2021-10-06: qty 1

## 2021-10-06 MED ORDER — METHOCARBAMOL 1000 MG/10ML IJ SOLN
500.0000 mg | Freq: Four times a day (QID) | INTRAVENOUS | Status: DC | PRN
  Filled 2021-10-06: qty 5

## 2021-10-06 MED ORDER — DEXAMETHASONE SODIUM PHOSPHATE 10 MG/ML IJ SOLN
INTRAMUSCULAR | Status: DC | PRN
  Administered 2021-10-06: 10 mg via INTRAVENOUS

## 2021-10-06 MED ORDER — ORAL CARE MOUTH RINSE: 15.0000 mL | Freq: Once | OROMUCOSAL | Status: AC

## 2021-10-06 MED ORDER — CHLORHEXIDINE GLUCONATE 0.12 % MT SOLN: 15.0000 mL | Freq: Once | OROMUCOSAL | Status: AC

## 2021-10-06 SURGICAL SUPPLY — 54 items
BAG COUNTER SPONGE SURGICOUNT (BAG) ×2 IMPLANT
BENZOIN TINCTURE PRP APPL 2/3 (GAUZE/BANDAGES/DRESSINGS) ×2 IMPLANT
BUR CARBIDE MATCH 3.0 (BURR) ×2 IMPLANT
CANISTER SUCT 3000ML PPV (MISCELLANEOUS) ×2 IMPLANT
DRAPE C-ARM 42X72 X-RAY (DRAPES) ×4 IMPLANT
DRAPE LAPAROTOMY 100X72 PEDS (DRAPES) ×2 IMPLANT
DRSG OPSITE POSTOP 4X6 (GAUZE/BANDAGES/DRESSINGS) ×1 IMPLANT
DURAPREP 6ML APPLICATOR 50/CS (WOUND CARE) ×2 IMPLANT
ELECT REM PT RETURN 9FT ADLT (ELECTROSURGICAL) ×2
ELECTRODE REM PT RTRN 9FT ADLT (ELECTROSURGICAL) ×1 IMPLANT
EVACUATOR 1/8 PVC DRAIN (DRAIN) ×1 IMPLANT
GAUZE 4X4 16PLY ~~LOC~~+RFID DBL (SPONGE) ×1 IMPLANT
GAUZE SPONGE 4X4 12PLY STRL (GAUZE/BANDAGES/DRESSINGS) ×2 IMPLANT
GLOVE SURG ENC MOIS LTX SZ7 (GLOVE) ×1 IMPLANT
GLOVE SURG ENC MOIS LTX SZ8 (GLOVE) ×3 IMPLANT
GLOVE SURG UNDER POLY LF SZ7 (GLOVE) ×1 IMPLANT
GOWN STRL REUS W/ TWL LRG LVL3 (GOWN DISPOSABLE) IMPLANT
GOWN STRL REUS W/ TWL XL LVL3 (GOWN DISPOSABLE) ×1 IMPLANT
GOWN STRL REUS W/TWL 2XL LVL3 (GOWN DISPOSABLE) IMPLANT
GOWN STRL REUS W/TWL LRG LVL3 (GOWN DISPOSABLE) ×2
GOWN STRL REUS W/TWL XL LVL3 (GOWN DISPOSABLE) ×2
HEMOSTAT POWDER KIT SURGIFOAM (HEMOSTASIS) ×1 IMPLANT
KIT BASIN OR (CUSTOM PROCEDURE TRAY) ×2 IMPLANT
KIT TURNOVER KIT B (KITS) ×2 IMPLANT
MARKER SKIN DUAL TIP RULER LAB (MISCELLANEOUS) ×2 IMPLANT
MATRIX SPINE STRIP NEOCORE 5CC (Putty) IMPLANT
NDL HYPO 18GX1.5 BLUNT FILL (NEEDLE) IMPLANT
NDL HYPO 25X1 1.5 SAFETY (NEEDLE) ×1 IMPLANT
NDL SPNL 20GX3.5 QUINCKE YW (NEEDLE) ×1 IMPLANT
NEEDLE HYPO 18GX1.5 BLUNT FILL (NEEDLE) IMPLANT
NEEDLE HYPO 25X1 1.5 SAFETY (NEEDLE) ×2 IMPLANT
NEEDLE SPNL 20GX3.5 QUINCKE YW (NEEDLE) IMPLANT
NS IRRIG 1000ML POUR BTL (IV SOLUTION) ×2 IMPLANT
PACK LAMINECTOMY NEURO (CUSTOM PROCEDURE TRAY) ×2 IMPLANT
PIN MAYFIELD SKULL DISP (PIN) ×2 IMPLANT
PUTTY DBM 10CC (Putty) ×1 IMPLANT
ROD LORD INV 3.5X70 (Rod) ×2 IMPLANT
SCREW PA 3.5X12 (Screw) ×1 IMPLANT
SCREW PA 4.5X20 (Screw) ×1 IMPLANT
SCREW PA 4.5X22 (Screw) ×1 IMPLANT
SCREW POLYAXIAL 3.5 X 14 (Screw) ×7 IMPLANT
SCREW SET ATEC (Screw) ×10 IMPLANT
SPONGE SURGIFOAM ABS GEL 100 (HEMOSTASIS) ×2 IMPLANT
STRIP CLOSURE SKIN 1/2X4 (GAUZE/BANDAGES/DRESSINGS) ×2 IMPLANT
STRIP MATRIX NEOCORE 5CC (Putty) ×1 IMPLANT
SUT VIC AB 0 CT1 18XCR BRD8 (SUTURE) ×1 IMPLANT
SUT VIC AB 0 CT1 8-18 (SUTURE) ×1
SUT VIC AB 2-0 CP2 18 (SUTURE) ×2 IMPLANT
SUT VIC AB 3-0 SH 8-18 (SUTURE) IMPLANT
TOWEL GREEN STERILE (TOWEL DISPOSABLE) ×2 IMPLANT
TOWEL GREEN STERILE FF (TOWEL DISPOSABLE) ×2 IMPLANT
TRAY FOLEY MTR SLVR 16FR STAT (SET/KITS/TRAYS/PACK) IMPLANT
UNDERPAD 30X36 HEAVY ABSORB (UNDERPADS AND DIAPERS) ×2 IMPLANT
WATER STERILE IRR 1000ML POUR (IV SOLUTION) ×2 IMPLANT

## 2021-10-06 NOTE — Progress Notes (Signed)
°  Transition of Care [facility]) Screening Note   Patient Details  Name: Marcus James Date of Birth: 1966-01-09   Transition of Care [facility]) CM/SW Contact:    Eduard Roux, LCSW Phone Number: 10/06/2021, 11:16 AM    Transition of Care Department [facility]) has reviewed patient and no TOC needs have been identified at this time. We will continue to monitor patient advancement through interdisciplinary progression rounds. If new patient transition needs arise, please place a TOC consult.

## 2021-10-06 NOTE — Anesthesia Procedure Notes (Signed)
Arterial Line Insertion Start/End12/15/2022 3:40 PM, 10/06/2021 5:49 PM Performed by: Dorris Singh, MD, Ayesha Rumpf, CRNA, CRNA  Patient location: Pre-op. Preanesthetic checklist: patient identified, IV checked, site marked, risks and benefits discussed, surgical consent, monitors and equipment checked, pre-op evaluation, timeout performed and anesthesia consent Lidocaine 1% used for infiltration radial was placed Catheter size: 20 G Hand hygiene performed , maximum sterile barriers used  and Seldinger technique used Allen's test indicative of satisfactory collateral circulation Attempts: 2 Procedure performed using ultrasound guided technique. Ultrasound Notes:anatomy identified, needle tip was noted to be adjacent to the nerve/plexus identified and no ultrasound evidence of intravascular and/or intraneural injection Following insertion, dressing applied. Post procedure assessment: normal and unchanged  Patient tolerated the procedure well with no immediate complications.

## 2021-10-06 NOTE — Progress Notes (Signed)
Pt performed NIF -40 and VC 1L with great effort.

## 2021-10-06 NOTE — Progress Notes (Signed)
Subjective: Patient reports he is stable  Objective: Vital signs in last 24 hours: Temp:  [98.5 F (36.9 C)-100.1 F (37.8 C)] 98.5 F (36.9 C) (12/15 0325) Pulse Rate:  [63-77] 65 (12/14 2108) Resp:  [16-28] 16 (12/14 2108) BP: (112-178)/(65-131) 126/75 (12/15 0325) SpO2:  [93 %-100 %] 94 % (12/14 2233)  Intake/Output from previous day: 12/14 0701 - 12/15 0700 In: 567.4 [I.V.:567.4] Out: 765 [Urine:765] Intake/Output this shift: No intake/output data recorded.  L tricep weakness, grips ok, increased DTRs throughout. Gait spastic and unstable  Lab Results: Lab Results  Component Value Date   WBC 4.7 10/04/2021   HGB 14.3 10/04/2021   HCT 44.3 10/04/2021   MCV 94.5 10/04/2021   PLT 150 10/04/2021   Lab Results  Component Value Date   INR 1.1 10/06/2021   BMET Lab Results  Component Value Date   NA 136 10/04/2021   K 3.8 10/04/2021   CL 104 10/04/2021   CO2 26 10/04/2021   GLUCOSE 107 (H) 10/04/2021   BUN 7 10/04/2021   CREATININE 0.78 10/04/2021   CALCIUM 9.1 10/04/2021    Studies/Results: DG Chest 2 View  Result Date: 10/04/2021 CLINICAL DATA:  Cough EXAM: CHEST - 2 VIEW COMPARISON:  11/21/2018 FINDINGS: Mild peribronchial thickening and interstitial prominence. Heart is normal size. No effusions or acute bony abnormality. IMPRESSION: Peribronchial thickening and interstitial prominence could reflect bronchitic changes or atypical/viral infection. Electronically Signed   By: Charlett Nose M.D.   On: 10/04/2021 23:16   CT Head Wo Contrast  Result Date: 10/05/2021 CLINICAL DATA:  Extremity numbness, neurodegenerative disorder suspected EXAM: CT HEAD WITHOUT CONTRAST TECHNIQUE: Contiguous axial images were obtained from the base of the skull through the vertex without intravenous contrast. COMPARISON:  11/21/2018 FINDINGS: Brain: No evidence of acute infarction, hemorrhage, hydrocephalus, extra-axial collection or mass lesion/mass effect. Vascular: No hyperdense  vessel or unexpected calcification. Skull: Normal. Negative for fracture or focal lesion. Sinuses/Orbits: No acute finding. Other: None. IMPRESSION: No acute intracranial pathology. Electronically Signed   By: Jearld Lesch M.D.   On: 10/05/2021 12:24   MR BRAIN W WO CONTRAST  Result Date: 10/05/2021 CLINICAL DATA:  Progressively worsening numbness to hands EXAM: MRI HEAD WITHOUT AND WITH CONTRAST MRI CERVICAL SPINE WITHOUT AND WITH CONTRAST TECHNIQUE: Multiplanar, multiecho pulse sequences of the brain and surrounding structures, and cervical spine, to include the craniocervical junction and cervicothoracic junction, were obtained without and with intravenous contrast. CONTRAST:  64mL GADAVIST GADOBUTROL 1 MMOL/ML IV SOLN COMPARISON:  None. FINDINGS: MRI HEAD FINDINGS Brain: There is no acute infarction or intracranial hemorrhage. There is no intracranial mass, mass effect, or edema. There is no hydrocephalus or extra-axial fluid collection. Ventricles and sulci are within normal limits in size and configuration. Scattered small foci of T2 hyperintensity in the supratentorial white matter likely reflect nonspecific gliosis/demyelination. No abnormal enhancement. Vascular: Major vessel flow voids at the skull base are preserved. Skull: Normal marrow signal is preserved. Sinuses/Orbits: Mild to moderate mucosal thickening. Orbits are unremarkable. Other: Sella is unremarkable.  Mastoid air cells are clear. MRI CERVICAL SPINE FINDINGS Alignment: Mild degenerative listhesis. Vertebrae: Multilevel degenerative endplate irregularity. Mild marrow edema associated with right C5-C6 facets likely on a degenerative basis. Cord: Patchy abnormal cord T2 hyperintensity at the C4-C5 to C5-C6 levels. Posterior Fossa, vertebral arteries, paraspinal tissues: Unremarkable. Disc levels: C2-C3:  No canal or foraminal stenosis. C3-C4: Disc bulge with endplate osteophytes. Uncovertebral hypertrophy. Moderate canal stenosis. Moderate  right and marked left foraminal stenosis. C4-C5:  Disc bulge with endplate osteophytes. Uncovertebral hypertrophy. Marked canal stenosis. Marked foraminal stenosis. C5-C6: Disc bulge with endplate osteophytes. Uncovertebral hypertrophy. Marked canal stenosis. Marked foraminal stenosis. C6-C7: Disc bulge with endplate osteophytes. Uncovertebral hypertrophy. Moderate canal stenosis. Marked foraminal stenosis. C7-T1: Anterolisthesis uncovering of disc. Endplate osteophytes. Facet hypertrophy. Mild canal stenosis. Moderate to marked right and marked left foraminal stenosis. IMPRESSION: Multilevel cervical spine degenerative changes as detailed above. There is marked canal stenosis at C4-C5 and C5-C6. Abnormal cord T2 hyperintensity is present at this level likely reflecting edema. There is also multilevel marked foraminal stenosis. No evidence of recent infarction, hemorrhage, or mass. Minor burden of nonspecific gliosis/demyelination in the cerebral white matter. Preliminary cervical spine results were discussed by telephone at the time of imaging completion on 10/05/2021 with provider [facility] , who verbally acknowledged these results. Electronically Signed   By: Guadlupe Spanish M.D.   On: 10/05/2021 16:00   MR Cervical Spine W or Wo Contrast  Result Date: 10/05/2021 CLINICAL DATA:  Progressively worsening numbness to hands EXAM: MRI HEAD WITHOUT AND WITH CONTRAST MRI CERVICAL SPINE WITHOUT AND WITH CONTRAST TECHNIQUE: Multiplanar, multiecho pulse sequences of the brain and surrounding structures, and cervical spine, to include the craniocervical junction and cervicothoracic junction, were obtained without and with intravenous contrast. CONTRAST:  30mL GADAVIST GADOBUTROL 1 MMOL/ML IV SOLN COMPARISON:  None. FINDINGS: MRI HEAD FINDINGS Brain: There is no acute infarction or intracranial hemorrhage. There is no intracranial mass, mass effect, or edema. There is no hydrocephalus or extra-axial fluid collection.  Ventricles and sulci are within normal limits in size and configuration. Scattered small foci of T2 hyperintensity in the supratentorial white matter likely reflect nonspecific gliosis/demyelination. No abnormal enhancement. Vascular: Major vessel flow voids at the skull base are preserved. Skull: Normal marrow signal is preserved. Sinuses/Orbits: Mild to moderate mucosal thickening. Orbits are unremarkable. Other: Sella is unremarkable.  Mastoid air cells are clear. MRI CERVICAL SPINE FINDINGS Alignment: Mild degenerative listhesis. Vertebrae: Multilevel degenerative endplate irregularity. Mild marrow edema associated with right C5-C6 facets likely on a degenerative basis. Cord: Patchy abnormal cord T2 hyperintensity at the C4-C5 to C5-C6 levels. Posterior Fossa, vertebral arteries, paraspinal tissues: Unremarkable. Disc levels: C2-C3:  No canal or foraminal stenosis. C3-C4: Disc bulge with endplate osteophytes. Uncovertebral hypertrophy. Moderate canal stenosis. Moderate right and marked left foraminal stenosis. C4-C5: Disc bulge with endplate osteophytes. Uncovertebral hypertrophy. Marked canal stenosis. Marked foraminal stenosis. C5-C6: Disc bulge with endplate osteophytes. Uncovertebral hypertrophy. Marked canal stenosis. Marked foraminal stenosis. C6-C7: Disc bulge with endplate osteophytes. Uncovertebral hypertrophy. Moderate canal stenosis. Marked foraminal stenosis. C7-T1: Anterolisthesis uncovering of disc. Endplate osteophytes. Facet hypertrophy. Mild canal stenosis. Moderate to marked right and marked left foraminal stenosis. IMPRESSION: Multilevel cervical spine degenerative changes as detailed above. There is marked canal stenosis at C4-C5 and C5-C6. Abnormal cord T2 hyperintensity is present at this level likely reflecting edema. There is also multilevel marked foraminal stenosis. No evidence of recent infarction, hemorrhage, or mass. Minor burden of nonspecific gliosis/demyelination in the cerebral  white matter. Preliminary cervical spine results were discussed by telephone at the time of imaging completion on 10/05/2021 with provider [facility] , who verbally acknowledged these results. Electronically Signed   By: Guadlupe Spanish M.D.   On: 10/05/2021 16:00   MR THORACIC SPINE W WO CONTRAST  Result Date: 10/05/2021 CLINICAL DATA:  Demyelinating disease EXAM: MRI THORACIC WITHOUT AND WITH CONTRAST TECHNIQUE: Multiplanar and multiecho pulse sequences of the thoracic spine were obtained without and with intravenous  contrast. CONTRAST:  40mL GADAVIST GADOBUTROL 1 MMOL/ML IV SOLN COMPARISON:  None. FINDINGS: Motion limited study.  Within this limitation: Alignment: Normal. Vertebrae: Vertebral body heights are maintained. No focal marrow edema to suggest acute fracture or discitis/osteomyelitis. No suspicious bone lesions. Cord: Motion limited evaluation without definite cord signal abnormality or enhancement. Paraspinal and other soft tissues: Mild opacities in lung bilaterally. Disc levels: No significant disc protrusions, canal stenosis, or foraminal stenosis. Mildly prominent dorsal epidural fat. IMPRESSION: 1. Motion limited study without definite cord signal abnormality or enhancement in the thoracic spine. 2. No significant stenosis in the thoracic spine. 3. Mild opacities in lung bilaterally, better characterized on recent chest radiograph. Electronically Signed   By: Feliberto Harts M.D.   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  We used an interpreter this morning to go over the surgery as best we can and we described the technique, and the benefits and potential harms of the surgery.  He understands that the risk of the surgery include but are not limited to bleeding, infection, spinal cord  injury which can occur during intubation, positioning, or during surgery, , nerve injury, numbness, weakness, paralysis, loss of bowel bladder or sexual function, pseudoarthrosis, misplaced hardware, failure of hardware, lack of relief of symptoms, worsening symptoms, need for further surgery, pseudoarthrosis, CSF leak and anesthesia risk including DVT pneumonia MI and death.  They agree to proceed  Estimated body mass index is 24 kg/m as calculated from the following:   Height as of this encounter: 4' 11.06" (1.5 m).   Weight as of this encounter: 54 kg.    LOS: 1 day    Tia Alert 10/06/2021, 8:16 AM

## 2021-10-06 NOTE — Anesthesia Procedure Notes (Signed)
Procedure Name: Intubation Date/Time: 10/06/2021 3:44 PM Performed by: Ayesha Rumpf, CRNA Pre-anesthesia Checklist: Patient identified, Emergency Drugs available, Suction available and Patient being monitored Patient Re-evaluated:Patient Re-evaluated prior to induction Oxygen Delivery Method: Circle System Utilized Preoxygenation: Pre-oxygenation with 100% oxygen Induction Type: IV induction Laryngoscope Size: Glidescope and 4 Grade View: Grade II Tube type: Oral Number of attempts: 1 Airway Equipment and Method: Stylet and Oral airway Placement Confirmation: ETT inserted through vocal cords under direct vision, positive ETCO2 and breath sounds checked- equal and bilateral Secured at: 23 cm Tube secured with: Tape Dental Injury: Teeth and Oropharynx as per pre-operative assessment  Difficulty Due To: Difficulty was anticipated, Difficult Airway- due to reduced neck mobility and Difficult Airway- due to cervical collar Comments: Mask ventilation NOT attempted.

## 2021-10-06 NOTE — Progress Notes (Addendum)
Gave report to [place] in the OR for him to go to surgery.

## 2021-10-06 NOTE — Op Note (Signed)
10/06/2021  6:28 PM  PATIENT:  Marcus James  55 y.o. 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  Decompressive cervical laminectomy, medial facetectomy foraminotomies C4-C5-C6, foraminotomies performed at C7-T1 , 2.  Posterior lateral arthrodesis C3-T1 inclusive utilizing locally harvested morselized autologous bone graft and morselized allograft, 3.  Segmental fixation C3-C7 utilizing Alphatec lateral mass screws from C3-C6 and pedicle screws at T1.  SURGEON:  Marikay Alar, MD  ASSISTANTS: Verlin Dike FNP  ANESTHESIA:   General  EBL: 100 ml  Total I/O In: 1000 [I.V.:1000] Out: 600 [Urine:500; Blood:100]  BLOOD ADMINISTERED: none  DRAINS: Medium Hemovac  SPECIMEN:  none  INDICATION FOR PROCEDURE: This patient presented with difficulty with gait and numbness and weakness in his hands and legs. Imaging showed near cervical spinal stenosis with signal change in the spinal cord C4-C6 and a subluxation of C7-T1. The patient tried conservative measures without relief. Pain was debilitating. Recommended posterior cervical decompression and instrumented fusion to address his spinal stenosis and the segmental instability. Patient understood the risks, benefits, and alternatives and potential outcomes and wished to proceed.  PROCEDURE DETAILS: The patient was brought to the operating room. Generalized endotracheal anesthesia was induced. The patient was affixed a 3 point [place] headrest and rolled into the prone position on chest rolls. All pressure points were padded. The posterior cervical region was cleaned and prepped with DuraPrep and then draped in the usual sterile fashion. 7 cc of local anesthesia was injected and a dorsal midline incision made in the posterior cervical region and carried down to the cervical fascia. The fascia was opened and the paraspinous musculature was taken  down to expose C3-T1. Intraoperative fluoroscopy confirmed my level and then the dissection was carried out over the lateral facets. I localized the midpoint of each lateral mass and marked a region 1 mm medial to the midpoint of the lateral mass, and then drilled in an upward and outward direction into the safe zone of each lateral mass. I drilled to a depth of 12 mm and then checked my drill hole with a ball probe. I then placed a 14 mm lateral mass screws into the safe zone of each lateral mass C3-C6 inclusive bilaterally until they were 2 fingers tight.  We then performed keyhole foraminotomies bilaterally C7-T1, open the yellow ligament to expose underlying dura and exiting C8 nerve root.  We could then palpate the T1 pedicle.  We used AP and lateral fluoroscopy to localize our pedicle screw entry zone and drilled into each pedicle to a depth of about 18 to 20 mm, tapped each pedicle with a 4 oh tap and then placed four 5 x 22 mm pedicle screws at T1 bilaterally.   I then turned my attention to the decompression.  We remove the spinous processes of C4-C5 and C6.  The high-speed drill to drill the lamina down to an eggshell at each level and drilled across the lateral lamina to release the lamina on each side.  The drill shavings were saved in a mucous trap for later arthrodesis.  Gently decompressed the central canal with the 1 and 2 mm Kerrison punch from C4 to C7. Medial facetectomies were performed, and foraminotomies were performed at C4-5. Once the decompression was complete the dura was full and capacious and I could see the spinal cord pulsatile through the dura. I then decorticated the lateral masses and the facet joints and packed them with local  autograft and morcellized allograft to perform arthrodesis from C3-T1 bilaterally. I then placed rods into the multiaxial screw heads of the screws and locked these into position with the locking caps and anti-torque device. I then checked the final  construct with AP/Lat fluoroscopy. I irrigated with saline solution containing bacitracin. I placed a medium Hemovac drain through separate stab incision, and lined the dura with Gelfoam. After hemostasis was achieved I closed the muscle and the fascia with 0 Vicryl, subcutaneous tissue with 2-0 Vicryl, and the subcuticular tissue with 3-0 Vicryl. The skin was closed with benzoin and Steri-Strips. A sterile dressing was applied, the patient was turned to the supine position and taken out of the headrest, awakened from general anesthesia and transferred to the recovery room in stable condition. At the end of the procedure all sponge, needle and instrument counts were correct.    PLAN OF CARE: Admit to inpatient   PATIENT DISPOSITION:  PACU - hemodynamically stable.   Delay start of Pharmacological VTE agent (>24hrs) due to surgical blood loss or risk of bleeding:  yes

## 2021-10-06 NOTE — Anesthesia Preprocedure Evaluation (Addendum)
Anesthesia Evaluation  Patient identified by MRN, date of birth, ID band Patient awake    Reviewed: Allergy & Precautions, NPO status , Patient's Chart, lab work & pertinent test results  Airway Mallampati: II  TM Distance: >3 FB     Dental   Pulmonary Current Smoker and Patient abstained from smoking.,    breath sounds clear to auscultation       Cardiovascular negative cardio ROS   Rhythm:Regular Rate:Normal     Neuro/Psych    GI/Hepatic negative GI ROS, Neg liver ROS,   Endo/Other    Renal/GU negative Renal ROS     Musculoskeletal   Abdominal   Peds  Hematology   Anesthesia Other Findings   Reproductive/Obstetrics                            Anesthesia Physical Anesthesia Plan  ASA: 3  Anesthesia Plan: General   Post-op Pain Management:    Induction:   PONV Risk Score and Plan: Treatment may vary due to age or medical condition  Airway Management Planned: Oral ETT  Additional Equipment: Arterial line  Intra-op Plan:   Post-operative Plan: Possible Post-op intubation/ventilation  Informed Consent: I have reviewed the patients History and Physical, chart, labs and discussed the procedure including the risks, benefits and alternatives for the proposed anesthesia with the patient or authorized representative who has indicated his/her understanding and acceptance.     Dental advisory given  Plan Discussed with: Anesthesiologist and CRNA  Anesthesia Plan Comments:       Anesthesia Quick Evaluation

## 2021-10-06 NOTE — Anesthesia Postprocedure Evaluation (Signed)
Anesthesia Post Note  Patient: Marcus James  Procedure(s) Performed: CERVICAL THREE-FOUR, CERVICAL FOUR-FIVE, CERVICAL FIVE-SIX, CERVICAL SIX-SEVEN, CERVICAL SEVEN-THORACIC ONE POSTERIOR CERVICAL FUSION WITH CERVICAL FOUR, CERVICAL FIVE AND CERVICAL SIX DECOMPRESSION (Spine Cervical)     Patient location during evaluation: PACU Anesthesia Type: General Level of consciousness: awake and alert and patient cooperative (requires re-orienting periodically) Pain management: pain level controlled Vital Signs Assessment: post-procedure vital signs reviewed and stable Respiratory status: spontaneous breathing, nonlabored ventilation and respiratory function stable Cardiovascular status: blood pressure returned to baseline and stable Postop Assessment: no apparent nausea or vomiting Anesthetic complications: no   No notable events documented.  Last Vitals:  Vitals:   10/06/21 1945 10/06/21 2005  BP: 135/78 (!) 172/94  Pulse: 63 67  Resp: 15   Temp: 36.4 C 36.5 C  SpO2: 94% 93%    Last Pain:  Vitals:   10/06/21 2005  TempSrc: Oral  PainSc:                  Adler Alton,E. Chenel Wernli

## 2021-10-06 NOTE — Progress Notes (Signed)
Son: Onalee Hua [phone] per patient please call son first as his english is best.  Daughter: Marylu Lund [phone]  Wife: Oneal Grout [phone]  Patient's wife speaks very little english, interpreter needed

## 2021-10-06 NOTE — Transfer of Care (Signed)
Immediate Anesthesia Transfer of Care Note  Patient: Marcus James  Procedure(s) Performed: CERVICAL THREE-FOUR, CERVICAL FOUR-FIVE, CERVICAL FIVE-SIX, CERVICAL SIX-SEVEN, CERVICAL SEVEN-THORACIC ONE POSTERIOR CERVICAL FUSION WITH CERVICAL FOUR, CERVICAL FIVE AND CERVICAL SIX DECOMPRESSION (Spine Cervical)  Patient Location: PACU  Anesthesia Type:General  Level of Consciousness: awake, alert  and oriented  Airway & Oxygen Therapy: Patient Spontanous Breathing  Post-op Assessment: Report given to RN and Post -op Vital signs reviewed and stable  Post vital signs: Reviewed and stable  Last Vitals:  Vitals Value Taken Time  BP    Temp    Pulse 89 10/06/21 1846  Resp 18 10/06/21 1846  SpO2 92 % 10/06/21 1846  Vitals shown include unvalidated device data.  Last Pain:  Vitals:   10/06/21 1359  TempSrc: Oral  PainSc: 8          Complications: No notable events documented.

## 2021-10-06 NOTE — Progress Notes (Signed)
Patient needs interpretor. Speaks Swahili. Went for surgery around 1345 and has not returned.

## 2021-10-07 ENCOUNTER — Encounter (HOSPITAL_COMMUNITY): Payer: Self-pay | Admitting: Neurological Surgery

## 2021-10-07 ENCOUNTER — Inpatient Hospital Stay (HOSPITAL_COMMUNITY): Payer: Medicaid Other

## 2021-10-07 IMAGING — CR DG CHEST 2V
2 series · 2 of 2 positions shown · non-contrast
Comparison: [DATE]

CLINICAL DATA: Hypoxemia

EXAM:
CHEST - 2 VIEW

[chest pa]
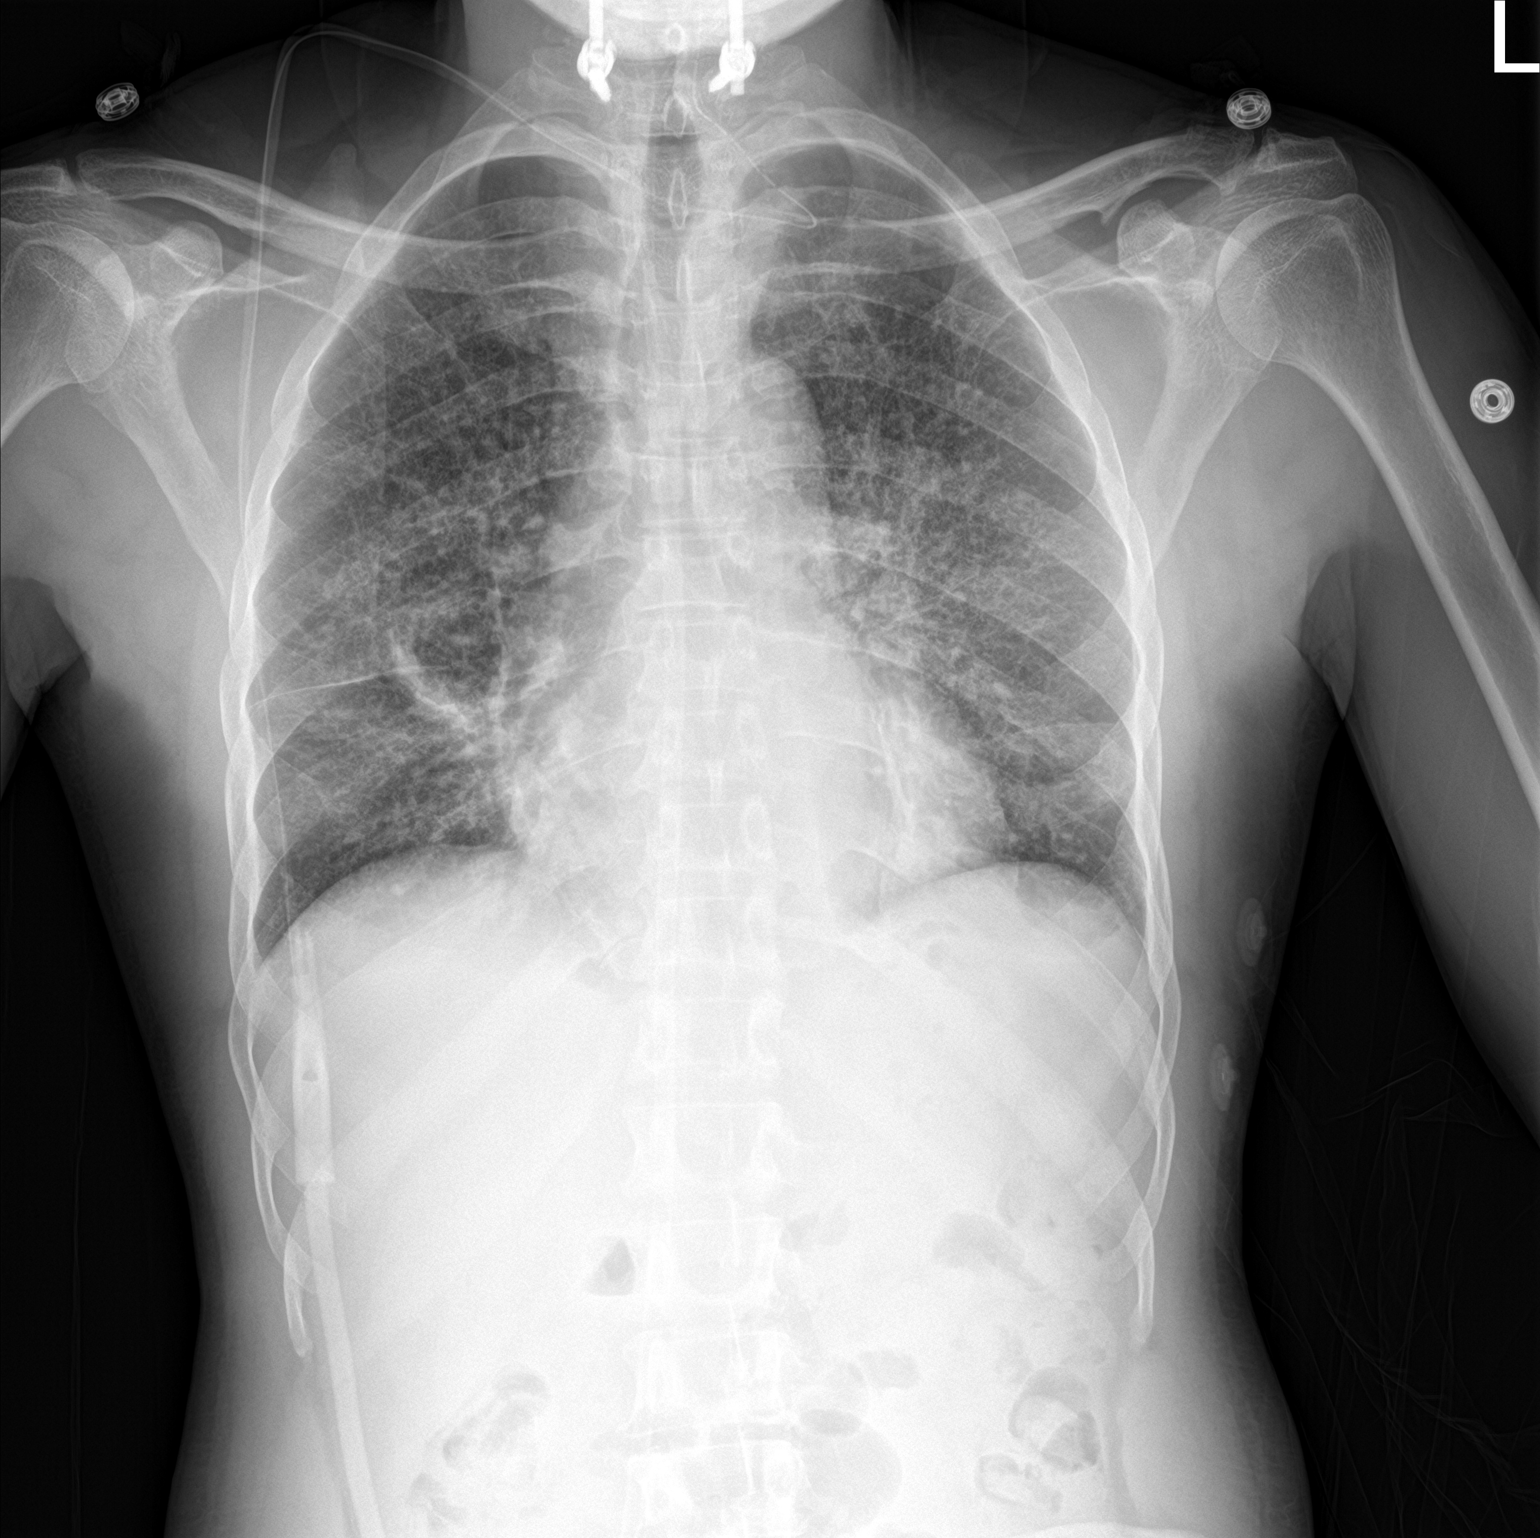

[chest lat]
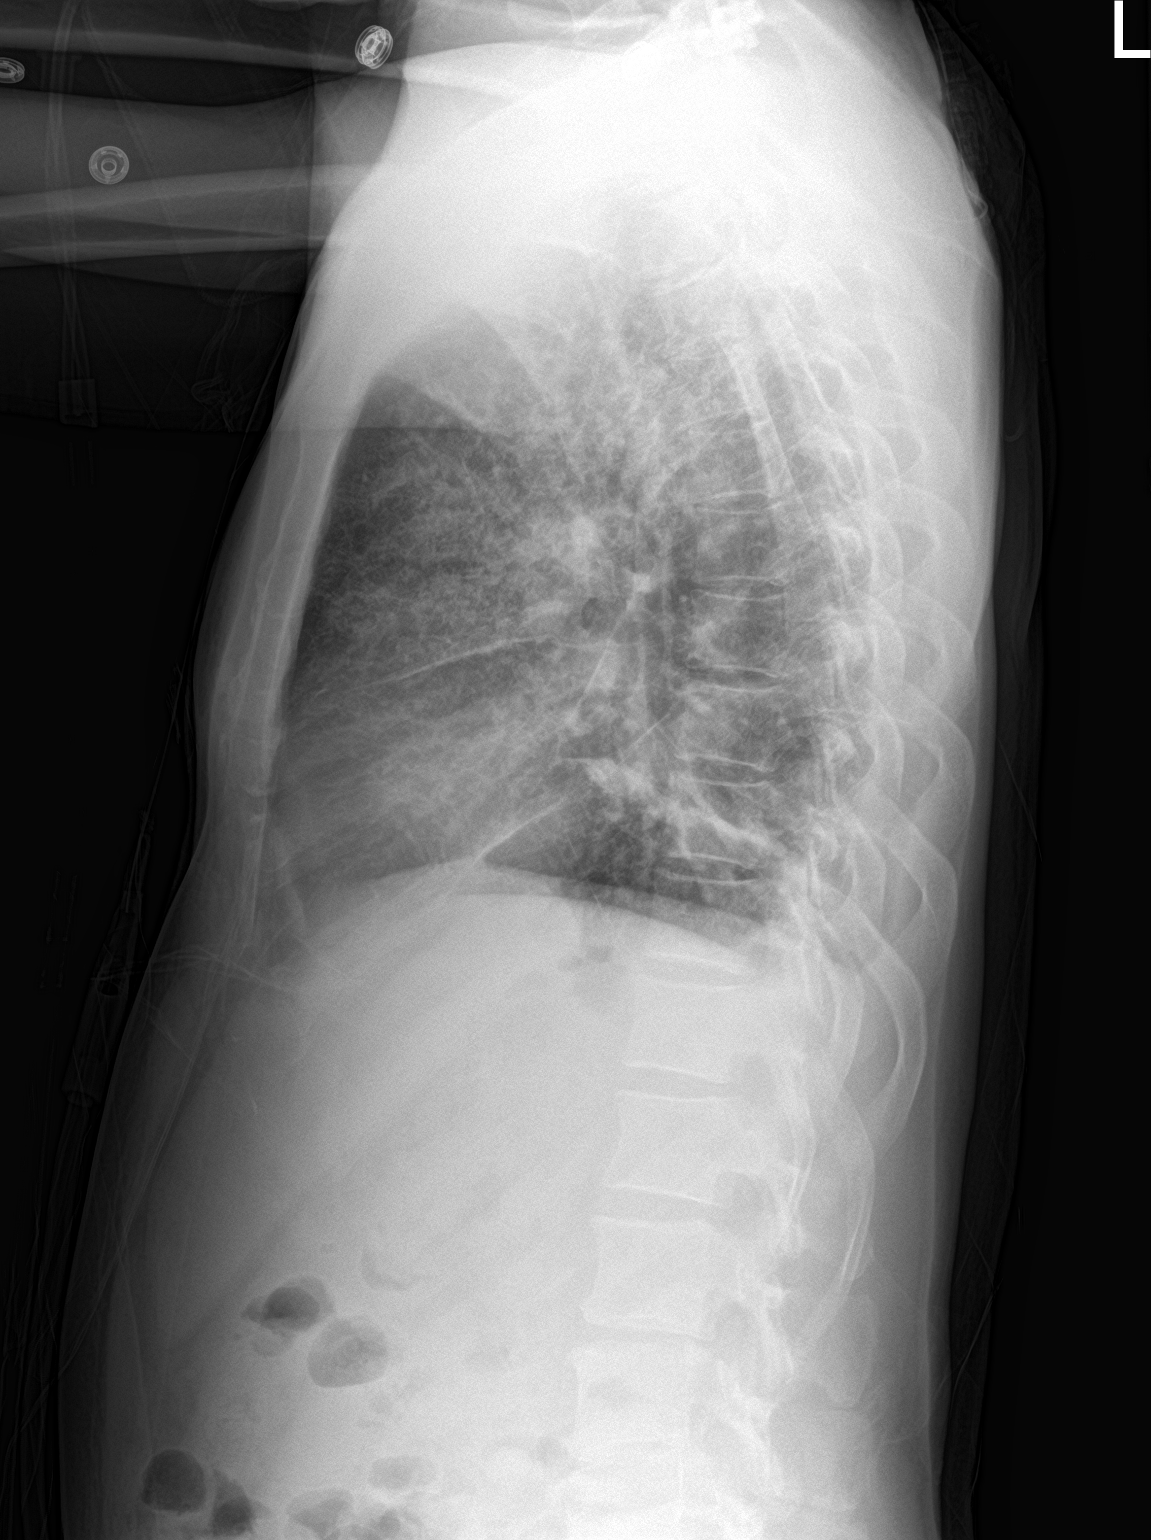

[2 of 2 positions shown; findings below may reference images not displayed]

FINDINGS: Cardiac shadow is stable. Lungs are well aerated bilaterally.
Increased vascular congestion and mild interstitial edema is noted.
No focal confluent infiltrate is seen. No pneumothorax or effusion
is noted. No acute bony abnormality is noted. Changes of recent
cervical fusion are noted with surgical drain in place.
IMPRESSION: Increased vascular congestion and edema consistent with mild CHF.

## 2021-10-07 NOTE — Progress Notes (Addendum)
Pt. Honeycomb dressing saturated with blood upon reassessment. Reinforced the dressing. Neuro surgery office notified. Provider called and advised to continue to reinforce the dressing as needed.

## 2021-10-07 NOTE — Evaluation (Signed)
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 Pt complains of 3-week duration of cough, generalized body aches. Underwent decompressive cervical laninectomy, medial foraminotomies c$-C5-C6, foraminotomies performed C7-T1; Segmental fixation C3-C7.  Clinical Impression  Patient admitted with above diagnosis. Patient presents with generalized weakness, impaired balance, decreased activity tolerance, impaired sensation, and impaired coordination. Patient requires minA for balance with ambulation with no AD but improved stability with use of RW. Educated patient on need for RW at home for stability due to fall risk, patient verbalized understanding and states he has one at home. Patient required 2.5L O2 [place] to maintain above 90%. Patient desatted to 83% on RA with mobility. Patient will benefit from skilled PT services during acute stay to address listed deficits. Recommend neuro OPPT follow up at discharge to address balance, coordination, and strength deficits.        Recommendations for follow up therapy are one component of a multi-disciplinary discharge planning process, led by the attending physician.  Recommendations may be updated based on patient status, additional functional criteria and insurance authorization.  Follow Up Recommendations Outpatient PT (neuro)    Assistance Recommended at Discharge Frequent or constant Supervision/Assistance  Functional Status Assessment Patient has had a recent decline in their functional status and demonstrates the ability to make significant improvements in function in a reasonable and predictable  amount of time.  Equipment Recommendations  Rolling [name] (2 wheels)    Recommendations for Other Services       Precautions / Restrictions Precautions Precautions: Fall;Cervical Required Braces or Orthoses: Cervical Brace Cervical Brace: Soft collar Restrictions Weight Bearing Restrictions: No      Mobility  Bed Mobility Overal bed mobility: Needs Assistance Bed Mobility: Sidelying to Sit   Sidelying to sit: Min guard       General bed mobility comments: mod VC for correct technique with log rolling and not sitting straight up from bed    Transfers Overall transfer level: Needs assistance   Transfers: Sit to/from Stand Sit to Stand: Min guard           General transfer comment: VC for posture    Ambulation/Gait Ambulation/Gait assistance: Min assist Gait Distance (Feet): 50 Feet Assistive device: None;Rolling [name] (2 wheels) Gait Pattern/deviations: Step-through pattern;Decreased stride length;Ataxic Gait velocity: decreased     General Gait Details: mild ataxia noted with mobility. MinA for balance with no AD. With use of RW, improved stability and required min guard.  Stairs            Wheelchair Mobility    Modified Rankin (Stroke Patients Only)       Balance Overall balance assessment: History of Falls;Needs assistance   Sitting balance-Leahy Scale: Fair       Standing balance-Leahy Scale: Fair                               Pertinent Vitals/Pain Pain Assessment: 0-10 Pain Score: 8  Pain Location: neck Pain Descriptors / Indicators: Discomfort;Grimacing Pain Intervention(s): Limited activity within patient's tolerance;Patient requesting pain meds-RN notified    Home Living Family/patient expects to be discharged to:: Private residence Living Arrangements: Spouse/significant other Available Help  at Discharge: Available 24 hours/day Type of Home: House Home Access: Ramped entrance       Home Layout: One  level Home Equipment: Agricultural consultant (2 wheels);Crutches      Prior Function Prior Level of Function : Independent/Modified Independent;Working/employed;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 zipping/buttoning are difficult; appears neurapraxic RUE Sensation:  ("numbness") RUE Coordination: decreased fine motor;decreased gross motor LUE Deficits / Details: similar to R    Lower Extremity Assessment Lower Extremity Assessment: RLE deficits/detail RLE Deficits / Details: patient reports no numbness while sitting EOB but with ambulation reports numbness in R LE. Strength WFL RLE Sensation:  ("numbness") RLE Coordination: decreased gross motor;decreased fine motor    Cervical / Trunk Assessment Cervical / Trunk Assessment: Neck Surgery  Communication   Communication: Prefers language other than English [name])  Cognition Arousal/Alertness: Awake/alert Behavior During Therapy: Impulsive (may be due to language barrier) Overall Cognitive Status: Within Functional Limits for tasks assessed                                          General Comments General comments (skin integrity, edema, etc.): Patient on 2 L O2 [place] on arrival with spO2 93%. On RA, drop to 87%. Ambulated on 2L O2 [place] with drop to 82%    Exercises     Assessment/Plan    PT Assessment Patient needs continued PT services  PT Problem List Decreased strength;Decreased activity tolerance;Decreased balance;Decreased mobility;Decreased coordination;Decreased safety awareness;Decreased knowledge of use of DME;Decreased knowledge of precautions;Cardiopulmonary status limiting activity;Impaired sensation       PT  Treatment Interventions DME instruction;Functional mobility training;Gait training;Therapeutic activities;Therapeutic exercise;Balance training;Neuromuscular re-education;Patient/family education    PT Goals (Current goals can be found in the Care Plan section)  Acute Rehab PT Goals Patient Stated Goal: to go home today PT Goal Formulation: With patient Time For Goal Achievement: 10/21/21 Potential to Achieve Goals: Good    Frequency Min 5X/week   Barriers to discharge        Co-evaluation               AM-PAC PT "6 Clicks" Mobility  Outcome Measure Help needed turning from your back to your side while in a flat bed without using bedrails?: A Little Help needed moving from lying on your back to sitting on the side of a flat bed without using bedrails?: A Little Help needed moving to and from a bed to a chair (including a wheelchair)?: A Little Help needed standing up from a chair using your arms (e.g., wheelchair or bedside chair)?: A Little Help needed to walk in hospital room?: A Little Help needed climbing 3-5 steps with a railing? : A Lot 6 Click Score: 17    End of Session Equipment Utilized During Treatment: Gait belt;Oxygen Activity Tolerance: Patient tolerated treatment well Patient left: in chair;with call bell/phone within reach;with chair alarm set;with family/visitor present Nurse Communication: Mobility status PT Visit Diagnosis: Unsteadiness on feet (R26.81);Muscle weakness (generalized) (M62.81);Ataxic gait (R26.0)    Time: 5883-2549 PT Time Calculation (min) (ACUTE ONLY): 36 min  Charges:   PT Evaluation $PT Eval Moderate Complexity: 1 Mod PT Treatments $Gait Training: 8-22 mins        Heer Justiss A. Dan Humphreys PT, DPT Acute Rehabilitation Services Pager [phone] Office [phone]   Viviann Spare 10/07/2021, 12:34 PM

## 2021-10-07 NOTE — Progress Notes (Signed)
Subjective: Patient reports he's doing well and feels better, seems to report less numbness and not much pain  Objective: Vital signs in last 24 hours: Temp:  [97.4 F (36.3 C)-98.4 F (36.9 C)] 97.7 F (36.5 C) (12/16 0729) Pulse Rate:  [59-87] 70 (12/16 0729) Resp:  [12-20] 18 (12/16 0729) BP: (117-177)/(68-108) 160/80 (12/16 0729) SpO2:  [91 %-97 %] 96 % (12/16 0729) Arterial Line BP: (187-229)/(96-99) 229/99 (12/15 1855)  Intake/Output from previous day: 12/15 0701 - 12/16 0700 In: 2271.3 [P.O.:600; I.V.:1557.2; IV Piggyback:114] Out: 2725 [Urine:2475; Drains:150; Blood:100] Intake/Output this shift: No intake/output data recorded.  He is moving extremities well with good grips, mild drainage at incision  Lab Results: Lab Results  Component Value Date   WBC 4.7 10/04/2021   HGB 14.3 10/04/2021   HCT 44.3 10/04/2021   MCV 94.5 10/04/2021   PLT 150 10/04/2021   Lab Results  Component Value Date   INR 1.1 10/06/2021   BMET Lab Results  Component Value Date   NA 136 10/04/2021   K 3.8 10/04/2021   CL 104 10/04/2021   CO2 26 10/04/2021   GLUCOSE 107 (H) 10/04/2021   BUN 7 10/04/2021   CREATININE 0.78 10/04/2021   CALCIUM 9.1 10/04/2021    Studies/Results: DG Cervical Spine 2 or 3 views  Result Date: 10/06/2021 CLINICAL DATA:  Cervical decompression EXAM: CERVICAL SPINE - 2-3 VIEW COMPARISON:  MRI 10/05/2021 FINDINGS: Two low resolution intraoperative spot views of the cervical spine. Total fluoroscopy time was 56 seconds. Images demonstrate posterior rod and fixating screws C3 through T1. IMPRESSION: Intraoperative fluoroscopic assistance provided during cervical spine surgery Electronically Signed   By: Jasmine Pang M.D.   On: 10/06/2021 18:32   CT Head Wo Contrast  Result Date: 10/05/2021 CLINICAL DATA:  Extremity numbness, neurodegenerative disorder suspected EXAM: CT HEAD WITHOUT CONTRAST TECHNIQUE: Contiguous axial images were obtained from the base of  the skull through the vertex without intravenous contrast. COMPARISON:  11/21/2018 FINDINGS: Brain: No evidence of acute infarction, hemorrhage, hydrocephalus, extra-axial collection or mass lesion/mass effect. Vascular: No hyperdense vessel or unexpected calcification. Skull: Normal. Negative for fracture or focal lesion. Sinuses/Orbits: No acute finding. Other: None. IMPRESSION: No acute intracranial pathology. Electronically Signed   By: Jearld Lesch M.D.   On: 10/05/2021 12:24   MR BRAIN W WO CONTRAST  Result Date: 10/05/2021 CLINICAL DATA:  Progressively worsening numbness to hands EXAM: MRI HEAD WITHOUT AND WITH CONTRAST MRI CERVICAL SPINE WITHOUT AND WITH CONTRAST TECHNIQUE: Multiplanar, multiecho pulse sequences of the brain and surrounding structures, and cervical spine, to include the craniocervical junction and cervicothoracic junction, were obtained without and with intravenous contrast. CONTRAST:  20mL GADAVIST GADOBUTROL 1 MMOL/ML IV SOLN COMPARISON:  None. FINDINGS: MRI HEAD FINDINGS Brain: There is no acute infarction or intracranial hemorrhage. There is no intracranial mass, mass effect, or edema. There is no hydrocephalus or extra-axial fluid collection. Ventricles and sulci are within normal limits in size and configuration. Scattered small foci of T2 hyperintensity in the supratentorial white matter likely reflect nonspecific gliosis/demyelination. No abnormal enhancement. Vascular: Major vessel flow voids at the skull base are preserved. Skull: Normal marrow signal is preserved. Sinuses/Orbits: Mild to moderate mucosal thickening. Orbits are unremarkable. Other: Sella is unremarkable.  Mastoid air cells are clear. MRI CERVICAL SPINE FINDINGS Alignment: Mild degenerative listhesis. Vertebrae: Multilevel degenerative endplate irregularity. Mild marrow edema associated with right C5-C6 facets likely on a degenerative basis. Cord: Patchy abnormal cord T2 hyperintensity at the C4-C5 to C5-C6  levels. Posterior Fossa, vertebral arteries, paraspinal tissues: Unremarkable. Disc levels: C2-C3:  No canal or foraminal stenosis. C3-C4: Disc bulge with endplate osteophytes. Uncovertebral hypertrophy. Moderate canal stenosis. Moderate right and marked left foraminal stenosis. C4-C5: Disc bulge with endplate osteophytes. Uncovertebral hypertrophy. Marked canal stenosis. Marked foraminal stenosis. C5-C6: Disc bulge with endplate osteophytes. Uncovertebral hypertrophy. Marked canal stenosis. Marked foraminal stenosis. C6-C7: Disc bulge with endplate osteophytes. Uncovertebral hypertrophy. Moderate canal stenosis. Marked foraminal stenosis. C7-T1: Anterolisthesis uncovering of disc. Endplate osteophytes. Facet hypertrophy. Mild canal stenosis. Moderate to marked right and marked left foraminal stenosis. IMPRESSION: Multilevel cervical spine degenerative changes as detailed above. There is marked canal stenosis at C4-C5 and C5-C6. Abnormal cord T2 hyperintensity is present at this level likely reflecting edema. There is also multilevel marked foraminal stenosis. No evidence of recent infarction, hemorrhage, or mass. Minor burden of nonspecific gliosis/demyelination in the cerebral white matter. Preliminary cervical spine results were discussed by telephone at the time of imaging completion on 10/05/2021 with provider MADISON [facility] , who verbally acknowledged these results. Electronically Signed   By: Guadlupe Spanish M.D.   On: 10/05/2021 16:00   MR Cervical Spine W or Wo Contrast  Result Date: 10/05/2021 CLINICAL DATA:  Progressively worsening numbness to hands EXAM: MRI HEAD WITHOUT AND WITH CONTRAST MRI CERVICAL SPINE WITHOUT AND WITH CONTRAST TECHNIQUE: Multiplanar, multiecho pulse sequences of the brain and surrounding structures, and cervical spine, to include the craniocervical junction and cervicothoracic junction, were obtained without and with intravenous contrast. CONTRAST:  35mL GADAVIST GADOBUTROL 1  MMOL/ML IV SOLN COMPARISON:  None. FINDINGS: MRI HEAD FINDINGS Brain: There is no acute infarction or intracranial hemorrhage. There is no intracranial mass, mass effect, or edema. There is no hydrocephalus or extra-axial fluid collection. Ventricles and sulci are within normal limits in size and configuration. Scattered small foci of T2 hyperintensity in the supratentorial white matter likely reflect nonspecific gliosis/demyelination. No abnormal enhancement. Vascular: Major vessel flow voids at the skull base are preserved. Skull: Normal marrow signal is preserved. Sinuses/Orbits: Mild to moderate mucosal thickening. Orbits are unremarkable. Other: Sella is unremarkable.  Mastoid air cells are clear. MRI CERVICAL SPINE FINDINGS Alignment: Mild degenerative listhesis. Vertebrae: Multilevel degenerative endplate irregularity. Mild marrow edema associated with right C5-C6 facets likely on a degenerative basis. Cord: Patchy abnormal cord T2 hyperintensity at the C4-C5 to C5-C6 levels. Posterior Fossa, vertebral arteries, paraspinal tissues: Unremarkable. Disc levels: C2-C3:  No canal or foraminal stenosis. C3-C4: Disc bulge with endplate osteophytes. Uncovertebral hypertrophy. Moderate canal stenosis. Moderate right and marked left foraminal stenosis. C4-C5: Disc bulge with endplate osteophytes. Uncovertebral hypertrophy. Marked canal stenosis. Marked foraminal stenosis. C5-C6: Disc bulge with endplate osteophytes. Uncovertebral hypertrophy. Marked canal stenosis. Marked foraminal stenosis. C6-C7: Disc bulge with endplate osteophytes. Uncovertebral hypertrophy. Moderate canal stenosis. Marked foraminal stenosis. C7-T1: Anterolisthesis uncovering of disc. Endplate osteophytes. Facet hypertrophy. Mild canal stenosis. Moderate to marked right and marked left foraminal stenosis. IMPRESSION: Multilevel cervical spine degenerative changes as detailed above. There is marked canal stenosis at C4-C5 and C5-C6. Abnormal cord T2  hyperintensity is present at this level likely reflecting edema. There is also multilevel marked foraminal stenosis. No evidence of recent infarction, hemorrhage, or mass. Minor burden of nonspecific gliosis/demyelination in the cerebral white matter. Preliminary cervical spine results were discussed by telephone at the time of imaging completion on 10/05/2021 with provider MADISON [facility] , who verbally acknowledged these results. Electronically Signed   By: Guadlupe Spanish M.D.   On: 10/05/2021 16:00   MR THORACIC  SPINE W WO CONTRAST  Result Date: 10/05/2021 CLINICAL DATA:  Demyelinating disease EXAM: MRI THORACIC WITHOUT AND WITH CONTRAST TECHNIQUE: Multiplanar and multiecho pulse sequences of the thoracic spine were obtained without and with intravenous contrast. CONTRAST:  73mL GADAVIST GADOBUTROL 1 MMOL/ML IV SOLN COMPARISON:  None. FINDINGS: Motion limited study.  Within this limitation: Alignment: Normal. Vertebrae: Vertebral body heights are maintained. No focal marrow edema to suggest acute fracture or discitis/osteomyelitis. No suspicious bone lesions. Cord: Motion limited evaluation without definite cord signal abnormality or enhancement. Paraspinal and other soft tissues: Mild opacities in lung bilaterally. Disc levels: No significant disc protrusions, canal stenosis, or foraminal stenosis. Mildly prominent dorsal epidural fat. IMPRESSION: 1. Motion limited study without definite cord signal abnormality or enhancement in the thoracic spine. 2. No significant stenosis in the thoracic spine. 3. Mild opacities in lung bilaterally, better characterized on recent chest radiograph. Electronically Signed   By: Feliberto Harts M.D.   On: 10/05/2021 15:51   DG C-Arm 1-60 Min-No Report  Result Date: 10/06/2021 Fluoroscopy was utilized by the requesting physician.  No radiographic interpretation.   DG C-Arm 1-60 Min-No Report  Result Date: 10/06/2021 Fluoroscopy was utilized by the requesting  physician.  No radiographic interpretation.   DG C-Arm 1-60 Min-No Report  Result Date: 10/06/2021 Fluoroscopy was utilized by the requesting physician.  No radiographic interpretation.    Assessment/Plan: Doing well, PT/OT, mobilize today  Estimated body mass index is 24 kg/m as calculated from the following:   Height as of this encounter: 4' 11.06" (1.5 m).   Weight as of this encounter: 54 kg.    LOS: 2 days    Tia Alert 10/07/2021, 7:31 AM

## 2021-10-07 NOTE — Progress Notes (Signed)
Orthopedic Tech Progress Note Patient Details:  Marcus James June 29, 1966 [id number]  Patient ID: [name], male   DOB: Jan 06, 1966, 55 y.o.   MRN: [id number] Patient already has on soft cervicale collar. Darleen Crocker 10/07/2021, 4:29 PM

## 2021-10-07 NOTE — Evaluation (Signed)
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 Pt complains of 3-week duration of cough, generalized body aches. Underwent decompressive cervical laninectomy, medial foraminotomies c$-C5-C6, foraminotomies performed C7-T1; Segmental fixation C3-C7.   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 Pt presents with apparent sensory motor deficits with BUE and an abnormal gait pattern, increasing his risk for falls.  During activity/mobility, pt desats to 83 on RA. Required 2.5 L to maintain above 90. Recommend follow up with OT at the neuro outpt center.      Recommendations for follow up therapy are one component of a multi-disciplinary discharge planning process, led by the attending physician.  Recommendations may be updated based on patient status, additional functional criteria and insurance authorization.   Follow Up Recommendations  Outpatient OT (neuro outpt)    Assistance Recommended at Discharge Intermittent Supervision/Assistance  Functional Status Assessment  Patient has had a recent decline in their functional status and demonstrates the ability to make significant improvements in function in a reasonable and predictable amount of time.  Equipment Recommendations  Tub/shower seat    Recommendations for Other Services       Precautions / Restrictions Precautions Precautions: Fall;Cervical Required Braces or Orthoses: Cervical Brace Cervical Brace: Soft collar      Mobility Bed Mobility Overal bed mobility: Needs Assistance Bed Mobility: Sidelying to Sit   Sidelying to sit:  Min guard       General bed mobility comments: mod VC for correct technique with log rolling and not sittin gstraight up form bed    Transfers Overall transfer level: Needs assistance   Transfers: Sit to/from Stand Sit to Stand: Min guard           General transfer comment: VC for posture      Balance Overall balance assessment: History of Falls;Needs assistance   Sitting balance-Leahy Scale: Fair       Standing balance-Leahy Scale: Fair                             ADL either performed or assessed with clinical judgement   ADL Overall ADL's : Needs assistance/impaired     Grooming: Supervision/safety;Set up   Upper Body Bathing: Supervision/ safety;Set up;Sitting   Lower Body Bathing: Minimal assistance   Upper Body Dressing : Minimal assistance   Lower Body Dressing: Minimal assistance   Toilet Transfer: Minimal assistance   Toileting- Clothing Manipulation and Hygiene: Minimal assistance       Functional mobility during ADLs: Minimal assistance (unsteady gait; fell in shower PTA)       Vision         Perception     Praxis      Pertinent Vitals/Pain Pain Assessment: 0-10 Pain Score: 8  Pain Location: neck Pain Descriptors / Indicators: Discomfort;Grimacing Pain Intervention(s): Limited activity within patient's tolerance;Patient requesting pain meds-RN notified     Hand Dominance Right   Extremity/Trunk Assessment Upper Extremity Assessment Upper Extremity Assessment: Generalized weakness;RUE deficits/detail;LUE deficits/detail RUE Deficits / Details: numbness; decreased fine motor/coordination but using funcitonally; tasks, i.e. zipping/buttoning are difficult; appears neurapraxic RUE Sensation:  ("numbness") RUE Coordination: decreased  fine motor;decreased gross motor LUE Deficits / Details: similar to R   Lower Extremity Assessment Lower Extremity Assessment: Defer to PT evaluation   Cervical / Trunk  Assessment Cervical / Trunk Assessment: Neck Surgery   Communication Communication Communication: Prefers language other than English Marcus James)   Cognition Arousal/Alertness: Awake/alert Behavior During Therapy: WFL for tasks assessed/performed Overall Cognitive Status: Within Functional Limits for tasks assessed                                       General Comments  wife present for session    Exercises     Shoulder Instructions      Home Living Family/patient expects to be discharged to:: Private residence Living Arrangements: Spouse/significant other Available Help at Discharge: Available 24 hours/day Type of Home: House Home Access: Ramped entrance     Home Layout: One level     Bathroom Shower/Tub: Tub/shower unit;Curtain   Firefighter: Standard Bathroom Accessibility: Yes How Accessible: Accessible via walker Home Equipment: Rolling Walker (2 wheels);Crutches          Prior Functioning/Environment Prior Level of Function : Independent/Modified Independent;Working/employed;Driving (works in a Field seismologist)                        OT Problem List: Impaired balance (sitting and/or standing);Decreased coordination;Decreased safety awareness;Decreased knowledge of use of DME or AE;Decreased knowledge of precautions;Impaired sensation;Impaired tone;Impaired UE functional use;Pain      OT Treatment/Interventions: Self-care/ADL training;Therapeutic exercise;Neuromuscular education;DME and/or AE instruction;Therapeutic activities;Patient/family education;Balance training    OT Goals(Current goals can be found in the care plan section) Acute Rehab OT Goals Patient Stated Goal: to get back to work so he cn pay bills OT Goal Formulation: With patient/family Time For Goal Achievement: 10/21/21 Potential to Achieve Goals: Good  OT Frequency: Min 2X/week   Barriers to D/C:            Co-evaluation               AM-PAC OT "6 Clicks" Daily Activity     Outcome Measure Help from another person eating meals?: None Help from another person taking care of personal grooming?: A Little Help from another person toileting, which includes using toliet, bedpan, or urinal?: A Little Help from another person bathing (including washing, rinsing, drying)?: A Little Help from another person to put on and taking off regular upper body clothing?: A Little Help from another person to put on and taking off regular lower body clothing?: A Little 6 Click Score: 19   End of Session Equipment Utilized During Treatment: Cervical collar;Rolling walker (2 wheels) Nurse Communication: Mobility status;Other (comment) (desats on RA)  Activity Tolerance: Patient tolerated treatment well Patient left: in chair;with call bell/phone within reach;with chair alarm set;with family/visitor present  OT Visit Diagnosis: Unsteadiness on feet (R26.81);Other abnormalities of gait and mobility (R26.89);Muscle weakness (generalized) (M62.81);History of falling (Z91.81);Ataxia, unspecified (R27.0);Pain Pain - part of body:  (neck)                Time: 1275-1700 OT Time Calculation (min): 58 min Charges:  OT General Charges $OT Visit: 1 Visit OT Evaluation $OT Eval Moderate Complexity: 1 Mod OT Treatments $Self Care/Home Management : 8-22 mins  Luisa Dago, OT/L   Acute OT Clinical Specialist Acute Rehabilitation Services Pager [phone] Office [phone]   [facility] 10/07/2021, 12:02 PM

## 2021-10-08 MED ORDER — OXYCODONE HCL 5 MG PO TABS: 5.0000 mg | ORAL_TABLET | ORAL | 0 refills | Status: DC | PRN

## 2021-10-08 MED ORDER — DEXAMETHASONE 4 MG PO TABS
4.0000 mg | ORAL_TABLET | Freq: Three times a day (TID) | ORAL | Status: DC
  Administered 2021-10-08 – 2021-10-09 (×2): 4 mg via ORAL
  Filled 2021-10-08 (×2): qty 1

## 2021-10-08 MED ORDER — DEXAMETHASONE SODIUM PHOSPHATE 4 MG/ML IJ SOLN: 4.0000 mg | Freq: Three times a day (TID) | INTRAMUSCULAR | Status: DC

## 2021-10-08 MED ORDER — METHOCARBAMOL 500 MG PO TABS: 500.0000 mg | ORAL_TABLET | Freq: Four times a day (QID) | ORAL | 1 refills | Status: DC | PRN

## 2021-10-08 MED ORDER — FUROSEMIDE 10 MG/ML IJ SOLN
20.0000 mg | Freq: Once | INTRAMUSCULAR | Status: AC
  Administered 2021-10-08: 20 mg via INTRAVENOUS
  Filled 2021-10-08: qty 2

## 2021-10-08 NOTE — Progress Notes (Signed)
Occupational Therapy Treatment Patient Details Name: Marcus James MRN: [id number] DOB: 1966-07-31 Today's Date: 10/08/2021   History of present illness 55 yo admitted with abnormal gait and numbness, pain and weakness in hands and legs, Imaging showed near cervical spinal stenosis with signal change in the spinal cord C4-C6 and a subluxation of C7-T1/cervical stenosis with cervical spondylitic myelopathy. Pt complains of 3-week duration of cough, generalized body aches. Underwent decompressive cervical laninectomy, medial foraminotomies c$-C5-C6, foraminotomies performed C7-T1; Segmental fixation C3-C7.   OT comments  Pt seen with family in room, PT services and interpreter services to maximize safety and functional outcomes of session. Session significantly limited by fluctuating level of O2 throughout, while on both 2 and 3L O2. With activity SpO2 levels dropping on 2L as low at 84 requiring increased O2 supply to return to 90 at rest. OT with focus on safety and maintenance of cervical restrictions throughout session, collar positioning and focus on cognition/safety during functional activities. Pt does not present with significant impairment, and supportive family, but at this time OT with concerns for safety with return to home d/t need for increased O2 supply. OT will continue follow acutely.    Recommendations for follow up therapy are one component of a multi-disciplinary discharge planning process, led by the attending physician.  Recommendations may be updated based on patient status, additional functional criteria and insurance authorization.    Follow Up Recommendations  Outpatient OT    Assistance Recommended at Discharge Intermittent Supervision/Assistance  Equipment Recommendations  Tub/shower seat    Recommendations for Other Services      Precautions / Restrictions Precautions Precautions: Fall;Cervical Precaution Comments: Monitor O2 Restrictions Weight Bearing  Restrictions: No       Mobility Bed Mobility Overal bed mobility: Needs Assistance Bed Mobility: Sidelying to Sit   Sidelying to sit: Min guard            Transfers       Sit to Stand: Min guard           General transfer comment: VC for safety and posture     Balance Overall balance assessment: History of Falls;Needs assistance                                         ADL either performed or assessed with clinical judgement   ADL       Grooming: Supervision/safety;Set up           Upper Body Dressing : Minimal assistance       Toilet Transfer: Min guard   Toileting- Clothing Manipulation and Hygiene: Min guard       Functional mobility during ADLs: Min guard General ADL Comments: reviewed positioning and use of cervical collar, demo's need for intermittent min A for seated ADLs d/t precautions but receptive to redirection    Extremity/Trunk Assessment              Vision       Perception     Praxis      Cognition Arousal/Alertness: Awake/alert Behavior During Therapy: Impulsive Overall Cognitive Status: Within Functional Limits for tasks assessed                                 General Comments: limited at times d/t need for interpreter  Exercises     Shoulder Instructions       General Comments monitorig of O2 throughout session, difficulty with staying >=90%. on 2L at rest sustaining around 90%, with activity dropping to 84% requiring increased O2 to 3L via [place] to increased to near 90%. with rest in recliner pt was able to achieve >90% on 3L at rest. nursing aware    Pertinent Vitals/ Pain       Pain Assessment: 0-10 Pain Score: 4   Home Living                                          Prior Functioning/Environment              Frequency  Min 2X/week        Progress Toward Goals  OT Goals(current goals can now be found in the care plan section)   Progress towards OT goals: Progressing toward goals  Acute Rehab OT Goals Patient Stated Goal: I want to go home OT Goal Formulation: With patient/family Time For Goal Achievement: 10/21/21  Plan      Co-evaluation    PT/OT/SLP Co-Evaluation/Treatment: Yes Reason for Co-Treatment: Necessary to address cognition/behavior during functional activity   OT goals addressed during session: ADL's and self-care      AM-PAC OT "6 Clicks" Daily Activity     Outcome Measure   Help from another person eating meals?: None Help from another person taking care of personal grooming?: A Little Help from another person toileting, which includes using toliet, bedpan, or urinal?: A Little Help from another person bathing (including washing, rinsing, drying)?: A Little Help from another person to put on and taking off regular upper body clothing?: A Little Help from another person to put on and taking off regular lower body clothing?: A Little 6 Click Score: 19    End of Session Equipment Utilized During Treatment: Cervical collar;Oxygen  OT Visit Diagnosis: Unsteadiness on feet (R26.81);Other abnormalities of gait and mobility (R26.89);Muscle weakness (generalized) (M62.81);History of falling (Z91.81);Ataxia, unspecified (R27.0);Pain   Activity Tolerance     Patient Left in chair;with call bell/phone within reach;with chair alarm set;with family/visitor present   Nurse Communication          Time: 1001-1035 OT Time Calculation (min): 34 min  Charges: OT General Charges $OT Visit: 1 Visit OT Treatments $Self Care/Home Management : 8-22 mins  Ardit Danh OTR/L acute rehab services Office: [phone]   10/08/2021, 11:46 AM

## 2021-10-08 NOTE — Discharge Summary (Signed)
Physician Discharge Summary  Patient ID: Marcus James MRN: [id number] DOB/AGE: 55/19/1967 55 y.o.  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  He was found to be hyperreflexic.  MRI showed severe stenosis with signal change in the cord consistent with myelopathy.  He was taken to the operating room where the patient underwent posterior cervical decompression and instrumented fusion. The patient tolerated the procedure well and was taken to the recovery room and then to the floor in stable condition. The hospital course was routine. There were no complications. The wound remained clean dry and intact. Pt had appropriate neck soreness. No complaints of new arm pain or new N/T/W. The patient remained afebrile with stable vital signs, and tolerated a regular diet. The patient continued to increase activities, and pain was well controlled with oral pain medications.   Consults: None  Significant Diagnostic Studies:  Results for orders placed or performed during the hospital encounter of 10/04/21  Resp Panel by RT-PCR (Flu A&B, Covid) Nasopharyngeal Swab   Specimen: Nasopharyngeal Swab; Nasopharyngeal(NP) swabs in vial transport medium  Result Value Ref Range   SARS Coronavirus 2 by RT PCR NEGATIVE NEGATIVE   Influenza A by PCR POSITIVE (A) NEGATIVE   Influenza B by PCR NEGATIVE NEGATIVE  Surgical pcr screen   Specimen: Nasal Mucosa; Nasal Swab  Result Value Ref Range   MRSA, PCR NEGATIVE NEGATIVE   Staphylococcus aureus NEGATIVE NEGATIVE  CBC  Result Value Ref Range   WBC 4.7 4.0 - 10.5 K/uL   RBC 4.69 4.22 - 5.81 MIL/uL   Hemoglobin 14.3 13.0 - 17.0 g/dL   HCT 44.3 39.0 - 52.0 %   MCV 94.5 80.0  - 100.0 fL   MCH 30.5 26.0 - 34.0 pg   MCHC 32.3 30.0 - 36.0 g/dL   RDW 13.0 11.5 - 15.5 %   Platelets 150 150 - 400 K/uL   nRBC 0.0 0.0 - 0.2 %  Basic metabolic panel  Result Value Ref Range   Sodium 136 135 - 145 mmol/L   Potassium 3.8 3.5 - 5.1 mmol/L   Chloride 104 98 - 111 mmol/L   CO2 26 22 - 32 mmol/L   Glucose, Bld 107 (H) 70 - 99 mg/dL   BUN 7 6 - 20 mg/dL   Creatinine, Ser 0.78 0.61 - 1.24 mg/dL   Calcium 9.1 8.9 - 10.3 mg/dL   GFR, Estimated >60 >60 mL/min   Anion gap 6 5 - 15  Hepatic function panel  Result Value Ref Range   Total Protein 7.0 6.5 - 8.1 g/dL   Albumin 3.1 (L) 3.5 - 5.0 g/dL   AST 47 (H) 15 - 41 U/L   ALT 40 0 - 44 U/L   Alkaline Phosphatase 133 (H) 38 - 126 U/L   Total Bilirubin 0.4 0.3 - 1.2 mg/dL   Bilirubin, Direct 0.1 0.0 - 0.2 mg/dL   Indirect Bilirubin 0.3 0.3 - 0.9 mg/dL  Vitamin B12  Result Value Ref Range   Vitamin B-12 635 180 - 914 pg/mL  TSH  Result Value Ref Range   TSH 1.371 0.350 - 4.500 uIU/mL  Sedimentation rate  Result Value Ref Range   Sed Rate 14 0 - 16 mm/hr  C-reactive protein  Result Value Ref  Range   CRP 0.8 <1.0 mg/dL  HIV Antibody (routine testing w rflx)  Result Value Ref Range   HIV Screen 4th Generation wRfx Non Reactive Non Reactive  Hepatitis C antibody  Result Value Ref Range   HCV Ab NON REACTIVE NON REACTIVE  Protime-INR  Result Value Ref Range   Prothrombin Time 13.7 11.4 - 15.2 seconds   INR 1.1 0.8 - 1.2  Type and screen [facility]  Result Value Ref Range   ABO/RH(D) A POS    Antibody Screen NEG    Sample Expiration      10/09/2021,2359 Performed at [facility] [address], [place], [place] [address]   ABO/Rh  Result Value Ref Range   ABO/RH(D)      A POS Performed at [facility] [address] [place], [place] [address]     DG Chest 2 View  Result Date: 10/07/2021 CLINICAL DATA:  Hypoxemia EXAM: CHEST - 2 VIEW COMPARISON:  10/04/2021 FINDINGS:  Cardiac shadow is stable. Lungs are well aerated bilaterally. Increased vascular congestion and mild interstitial edema is noted. No focal confluent infiltrate is seen. No pneumothorax or effusion is noted. No acute bony abnormality is noted. Changes of recent cervical fusion are noted with surgical drain in place. IMPRESSION: Increased vascular congestion and edema consistent with mild CHF. Electronically Signed   By: Inez Catalina M.D.   On: 10/07/2021 17:20   DG Chest 2 View  Result Date: 10/04/2021 CLINICAL DATA:  Cough EXAM: CHEST - 2 VIEW COMPARISON:  11/21/2018 FINDINGS: Mild peribronchial thickening and interstitial prominence. Heart is normal size. No effusions or acute bony abnormality. IMPRESSION: Peribronchial thickening and interstitial prominence could reflect bronchitic changes or atypical/viral infection. Electronically Signed   By: Rolm Baptise M.D.   On: 10/04/2021 23:16   DG Cervical Spine 2 or 3 views  Result Date: 10/06/2021 CLINICAL DATA:  Cervical decompression EXAM: CERVICAL SPINE - 2-3 VIEW COMPARISON:  MRI 10/05/2021 FINDINGS: Two low resolution intraoperative spot views of the cervical spine. Total fluoroscopy time was 56 seconds. Images demonstrate posterior rod and fixating screws C3 through T1. IMPRESSION: Intraoperative fluoroscopic assistance provided during cervical spine surgery Electronically Signed   By: Donavan Foil M.D.   On: 10/06/2021 18:32   CT Head Wo Contrast  Result Date: 10/05/2021 CLINICAL DATA:  Extremity numbness, neurodegenerative disorder suspected EXAM: CT HEAD WITHOUT CONTRAST TECHNIQUE: Contiguous axial images were obtained from the base of the skull through the vertex without intravenous contrast. COMPARISON:  11/21/2018 FINDINGS: Brain: No evidence of acute infarction, hemorrhage, hydrocephalus, extra-axial collection or mass lesion/mass effect. Vascular: No hyperdense vessel or unexpected calcification. Skull: Normal. Negative for fracture or  focal lesion. Sinuses/Orbits: No acute finding. Other: None. IMPRESSION: No acute intracranial pathology. Electronically Signed   By: Delanna Ahmadi M.D.   On: 10/05/2021 12:24   MR BRAIN W WO CONTRAST  Result Date: 10/05/2021 CLINICAL DATA:  Progressively worsening numbness to hands EXAM: MRI HEAD WITHOUT AND WITH CONTRAST MRI CERVICAL SPINE WITHOUT AND WITH CONTRAST TECHNIQUE: Multiplanar, multiecho pulse sequences of the brain and surrounding structures, and cervical spine, to include the craniocervical junction and cervicothoracic junction, were obtained without and with intravenous contrast. CONTRAST:  37mL GADAVIST GADOBUTROL 1 MMOL/ML IV SOLN COMPARISON:  None. FINDINGS: MRI HEAD FINDINGS Brain: There is no acute infarction or intracranial hemorrhage. There is no intracranial mass, mass effect, or edema. There is no hydrocephalus or extra-axial fluid collection. Ventricles and sulci are within  normal limits in size and configuration. Scattered small foci of T2 hyperintensity in the supratentorial white matter likely reflect nonspecific gliosis/demyelination. No abnormal enhancement. Vascular: Major vessel flow voids at the skull base are preserved. Skull: Normal marrow signal is preserved. Sinuses/Orbits: Mild to moderate mucosal thickening. Orbits are unremarkable. Other: Sella is unremarkable.  Mastoid air cells are clear. MRI CERVICAL SPINE FINDINGS Alignment: Mild degenerative listhesis. Vertebrae: Multilevel degenerative endplate irregularity. Mild marrow edema associated with right C5-C6 facets likely on a degenerative basis. Cord: Patchy abnormal cord T2 hyperintensity at the C4-C5 to C5-C6 levels. Posterior Fossa, vertebral arteries, paraspinal tissues: Unremarkable. Disc levels: C2-C3:  No canal or foraminal stenosis. C3-C4: Disc bulge with endplate osteophytes. Uncovertebral hypertrophy. Moderate canal stenosis. Moderate right and marked left foraminal stenosis. C4-C5: Disc bulge with endplate  osteophytes. Uncovertebral hypertrophy. Marked canal stenosis. Marked foraminal stenosis. C5-C6: Disc bulge with endplate osteophytes. Uncovertebral hypertrophy. Marked canal stenosis. Marked foraminal stenosis. C6-C7: Disc bulge with endplate osteophytes. Uncovertebral hypertrophy. Moderate canal stenosis. Marked foraminal stenosis. C7-T1: Anterolisthesis uncovering of disc. Endplate osteophytes. Facet hypertrophy. Mild canal stenosis. Moderate to marked right and marked left foraminal stenosis. IMPRESSION: Multilevel cervical spine degenerative changes as detailed above. There is marked canal stenosis at C4-C5 and C5-C6. Abnormal cord T2 hyperintensity is present at this level likely reflecting edema. There is also multilevel marked foraminal stenosis. No evidence of recent infarction, hemorrhage, or mass. Minor burden of nonspecific gliosis/demyelination in the cerebral white matter. Preliminary cervical spine results were discussed by telephone at the time of imaging completion on 10/05/2021 with provider MADISON [facility] , who verbally acknowledged these results. Electronically Signed   By: Macy Mis M.D.   On: 10/05/2021 16:00   MR Cervical Spine W or Wo Contrast  Result Date: 10/05/2021 CLINICAL DATA:  Progressively worsening numbness to hands EXAM: MRI HEAD WITHOUT AND WITH CONTRAST MRI CERVICAL SPINE WITHOUT AND WITH CONTRAST TECHNIQUE: Multiplanar, multiecho pulse sequences of the brain and surrounding structures, and cervical spine, to include the craniocervical junction and cervicothoracic junction, were obtained without and with intravenous contrast. CONTRAST:  41mL GADAVIST GADOBUTROL 1 MMOL/ML IV SOLN COMPARISON:  None. FINDINGS: MRI HEAD FINDINGS Brain: There is no acute infarction or intracranial hemorrhage. There is no intracranial mass, mass effect, or edema. There is no hydrocephalus or extra-axial fluid collection. Ventricles and sulci are within normal limits in size and configuration.  Scattered small foci of T2 hyperintensity in the supratentorial white matter likely reflect nonspecific gliosis/demyelination. No abnormal enhancement. Vascular: Major vessel flow voids at the skull base are preserved. Skull: Normal marrow signal is preserved. Sinuses/Orbits: Mild to moderate mucosal thickening. Orbits are unremarkable. Other: Sella is unremarkable.  Mastoid air cells are clear. MRI CERVICAL SPINE FINDINGS Alignment: Mild degenerative listhesis. Vertebrae: Multilevel degenerative endplate irregularity. Mild marrow edema associated with right C5-C6 facets likely on a degenerative basis. Cord: Patchy abnormal cord T2 hyperintensity at the C4-C5 to C5-C6 levels. Posterior Fossa, vertebral arteries, paraspinal tissues: Unremarkable. Disc levels: C2-C3:  No canal or foraminal stenosis. C3-C4: Disc bulge with endplate osteophytes. Uncovertebral hypertrophy. Moderate canal stenosis. Moderate right and marked left foraminal stenosis. C4-C5: Disc bulge with endplate osteophytes. Uncovertebral hypertrophy. Marked canal stenosis. Marked foraminal stenosis. C5-C6: Disc bulge with endplate osteophytes. Uncovertebral hypertrophy. Marked canal stenosis. Marked foraminal stenosis. C6-C7: Disc bulge with endplate osteophytes. Uncovertebral hypertrophy. Moderate canal stenosis. Marked foraminal stenosis. C7-T1: Anterolisthesis uncovering of disc. Endplate osteophytes. Facet hypertrophy. Mild canal stenosis. Moderate to marked right and marked left foraminal stenosis. IMPRESSION: Multilevel  cervical spine degenerative changes as detailed above. There is marked canal stenosis at C4-C5 and C5-C6. Abnormal cord T2 hyperintensity is present at this level likely reflecting edema. There is also multilevel marked foraminal stenosis. No evidence of recent infarction, hemorrhage, or mass. Minor burden of nonspecific gliosis/demyelination in the cerebral white matter. Preliminary cervical spine results were discussed by  telephone at the time of imaging completion on 10/05/2021 with provider MADISON [facility] , who verbally acknowledged these results. Electronically Signed   By: Guadlupe Spanish M.D.   On: 10/05/2021 16:00   MR THORACIC SPINE W WO CONTRAST  Result Date: 10/05/2021 CLINICAL DATA:  Demyelinating disease EXAM: MRI THORACIC WITHOUT AND WITH CONTRAST TECHNIQUE: Multiplanar and multiecho pulse sequences of the thoracic spine were obtained without and with intravenous contrast. CONTRAST:  75mL GADAVIST GADOBUTROL 1 MMOL/ML IV SOLN COMPARISON:  None. FINDINGS: Motion limited study.  Within this limitation: Alignment: Normal. Vertebrae: Vertebral body heights are maintained. No focal marrow edema to suggest acute fracture or discitis/osteomyelitis. No suspicious bone lesions. Cord: Motion limited evaluation without definite cord signal abnormality or enhancement. Paraspinal and other soft tissues: Mild opacities in lung bilaterally. Disc levels: No significant disc protrusions, canal stenosis, or foraminal stenosis. Mildly prominent dorsal epidural fat. IMPRESSION: 1. Motion limited study without definite cord signal abnormality or enhancement in the thoracic spine. 2. No significant stenosis in the thoracic spine. 3. Mild opacities in lung bilaterally, better characterized on recent chest radiograph. Electronically Signed   By: Feliberto Harts M.D.   On: 10/05/2021 15:51   DG C-Arm 1-60 Min-No Report  Result Date: 10/06/2021 Fluoroscopy was utilized by the requesting physician.  No radiographic interpretation.   DG C-Arm 1-60 Min-No Report  Result Date: 10/06/2021 Fluoroscopy was utilized by the requesting physician.  No radiographic interpretation.   DG C-Arm 1-60 Min-No Report  Result Date: 10/06/2021 Fluoroscopy was utilized by the requesting physician.  No radiographic interpretation.    Antibiotics:  Anti-infectives (From admission, onward)    Start     Dose/Rate Route Frequency Ordered Stop    10/06/21 2100  ceFAZolin (ANCEF) IVPB 2g/100 mL premix        2 g 200 mL/hr over 30 Minutes Intravenous Every 8 hours 10/06/21 2014 10/07/21 1256   10/06/21 1445  ceFAZolin (ANCEF) IVPB 2g/100 mL premix        2 g 200 mL/hr over 30 Minutes Intravenous To [facility] 10/05/21 2226 10/06/21 1547       Discharge Exam: Blood pressure (!) 160/81, pulse 70, temperature 98.9 F (37.2 C), resp. rate 18, height 4' 11.06" (1.5 m), weight 54 kg, SpO2 91 %. Neurologic: Grossly normal, with mild ataxia and still some numbness Dressing dry  Discharge Medications:   Allergies as of 10/08/2021   No Known Allergies      Medication List     TAKE these medications    acetaminophen 500 MG tablet Commonly known as: TYLENOL Take 1,000 mg by mouth every 6 (six) hours as needed for mild pain.   ibuprofen 200 MG tablet Commonly known as: ADVIL Take 400 mg by mouth every 6 (six) hours as needed for mild pain.   methocarbamol 500 MG tablet Commonly known as: ROBAXIN Take 1 tablet (500 mg total) by mouth every 6 (six) hours as needed for muscle spasms.   oxyCODONE 5 MG immediate release tablet Commonly known as: Oxy IR/ROXICODONE Take 1 tablet (5 mg total) by mouth every 4 (four) hours as needed for moderate pain ((score 4  to 6)).               Durable Medical Equipment  (From admission, onward)           Start     Ordered   10/06/21 2015  DME Walker rolling  Once       Question Answer Comment  Walker: With [place]   Patient needs a walker to treat with the following condition Gait disturbance      10/06/21 2014            Disposition: Home   Final Dx: Posterior cervical decompression instrumented fusion C3-T1  Discharge Instructions      Remove dressing in 72 hours   Complete by: As directed    Call MD for:  difficulty breathing, headache or visual disturbances   Complete by: As directed    Call MD for:  persistant nausea and vomiting   Complete by:  As directed    Call MD for:  redness, tenderness, or signs of infection (pain, swelling, redness, odor or green/yellow discharge around incision site)   Complete by: As directed    Call MD for:  severe uncontrolled pain   Complete by: As directed    Call MD for:  temperature >100.4   Complete by: As directed    Diet - low sodium heart healthy   Complete by: As directed    Increase activity slowly   Complete by: As directed         Follow-up Information     Eustace Moore, MD. Schedule an appointment as soon as possible for a visit in 2 week(s).   Specialty: Neurosurgery Contact information: [address] [place] [address] [phone]                  Signed: Eustace Moore 10/08/2021, 9:26 AM

## 2021-10-08 NOTE — TOC Transition Note (Signed)
Transition of Care [facility]) - CM/SW Discharge Note   Patient Details  Name: Marcus James MRN: [id number] Date of Birth: 1966/05/26  [facility] CM/SW Contact:  Kallie Locks, RN Phone Number: [phone] 10/08/2021, 10:56 AM   Clinical Narrative:  Spoke with patient at bedside who reports he lives with family.   Confirms no insurance. MATCH letter provided.   Neuro outpatient rehab recommended. Outpatient therapy referral placed for neuro rehab. Patient states he has rolling walker at home.   Patient's nurse states patient will not need home 02.   Provided list of outpatient community care clinics for PCP appointment. Also placed on dc paper work.  No further needs assessed.     Final next level of care: OP Rehab Barriers to Discharge: No Barriers Identified   Patient Goals and CMS Choice Patient states their goals for this hospitalization and ongoing recovery are:: return home      Discharge Placement                       Discharge Plan and Services                                     Social Determinants of Health (SDOH) Interventions     Readmission Risk Interventions No flowsheet data found.

## 2021-10-08 NOTE — TOC CM/SW Note (Addendum)
1315 Received call from nursing that Marcus James needs home o2 after all. Discussed that patient will need oxygen orders. Call made to [employer]. Spoke with Jasmine to request oxygen delivery to room.  Addendum 1323: Received call from patient's nurse indicating dc is on hold due to patient's decreased 02 sats. O2 orders not placed at this time. [employer] will need to be contacted again and 02 orders will be needed if patient does go home with oxygen.   Raiford Noble, MSN, RN,BSN Inpatient [facility] Case Manager [phone]

## 2021-10-08 NOTE — Progress Notes (Signed)
Physical Therapy Treatment Patient Details Name: Marcus James MRN: [id number] DOB: 03/13/1966 Today's Date: 10/08/2021   History of Present Illness 55 yo admitted with abnormal gait and numbness, pain and weakness in hands and legs, Imaging showed near cervical spinal stenosis with signal change in the spinal cord C4-C6 and a subluxation of C7-T1/cervical stenosis with cervical spondylitic myelopathy. Pt complains of 3-week duration of cough, generalized body aches. Underwent decompressive cervical laninectomy, medial foraminotomies c$-C5-C6, foraminotomies performed C7-T1; Segmental fixation C3-C7.    PT Comments    Patient continues to require O2 for mobility and at rest. Patient ambulated short distance on 2L O2 [place] with drop to 83% requiring increase to 3L O2 to increase to 90% in addition to pursed lip breathing. Reinforced cervical precautions and safety concerns about returning home. Continue to recommend OPPT at discharge.     Recommendations for follow up therapy are one component of a multi-disciplinary discharge planning process, led by the attending physician.  Recommendations may be updated based on patient status, additional functional criteria and insurance authorization.  Follow Up Recommendations  Outpatient PT (neuro)     Assistance Recommended at Discharge Frequent or constant Supervision/Assistance  Equipment Recommendations  Rolling Donavyn Fecher (2 wheels)    Recommendations for Other Services       Precautions / Restrictions Precautions Precautions: Fall;Cervical Precaution Comments: Monitor O2 Restrictions Weight Bearing Restrictions: No     Mobility  Bed Mobility Overal bed mobility: Needs Assistance Bed Mobility: Rolling;Sidelying to Sit Rolling: Supervision Sidelying to sit: Supervision            Transfers Overall transfer level: Needs assistance Equipment used: None Transfers: Sit to/from Stand Sit to Stand: Min guard           General  transfer comment: min guard for safety. cues for posture and  safety    Ambulation/Gait Ambulation/Gait assistance: Min guard Gait Distance (Feet): 25 Feet Assistive device: None Gait Pattern/deviations: Step-through pattern;Decreased stride length;Ataxic Gait velocity: decreased     General Gait Details: limited mobility due to oxygen levels dropping to 83% on 2L O2. Min guard for safety. Balance deficits noted due to mild ataxia   Stairs             Wheelchair Mobility    Modified Rankin (Stroke Patients Only)       Balance Overall balance assessment: History of Falls;Needs assistance   Sitting balance-Leahy Scale: Fair     Standing balance support: No upper extremity supported Standing balance-Leahy Scale: Fair                              Cognition Arousal/Alertness: Awake/alert Behavior During Therapy: Impulsive Overall Cognitive Status: Within Functional Limits for tasks assessed                                 General Comments: limited at times d/t need for interpreter        Exercises      General Comments General comments (skin integrity, edema, etc.): monitorig of O2 throughout session, difficulty with staying >=90%. on 2L at rest sustaining around 90%, with activity dropping to 84% requiring increased O2 to 3L via [place] to increased to near 90%. with rest in recliner pt was able to achieve >90% on 3L at rest. nursing aware      Pertinent Vitals/Pain Pain Assessment: 0-10 Pain Score: 4  Pain  Location: neck Pain Descriptors / Indicators: Discomfort;Grimacing Pain Intervention(s): Monitored during session    Home Living                          Prior Function            PT Goals (current goals can now be found in the care plan section) Acute Rehab PT Goals PT Goal Formulation: With patient Time For Goal Achievement: 10/21/21 Potential to Achieve Goals: Good Progress towards PT goals: Progressing toward  goals    Frequency    Min 5X/week      PT Plan Current plan remains appropriate    Co-evaluation PT/OT/SLP Co-Evaluation/Treatment: Yes Reason for Co-Treatment: Necessary to address cognition/behavior during functional activity PT goals addressed during session: Mobility/safety with mobility;Balance OT goals addressed during session: ADL's and self-care      AM-PAC PT "6 Clicks" Mobility   Outcome Measure  Help needed turning from your back to your side while in a flat bed without using bedrails?: A Little Help needed moving from lying on your back to sitting on the side of a flat bed without using bedrails?: A Little Help needed moving to and from a bed to a chair (including a wheelchair)?: A Little Help needed standing up from a chair using your arms (e.g., wheelchair or bedside chair)?: A Little Help needed to walk in hospital room?: A Little Help needed climbing 3-5 steps with a railing? : A Lot 6 Click Score: 17    End of Session Equipment Utilized During Treatment: Gait belt;Oxygen Activity Tolerance: Patient tolerated treatment well Patient left: in chair;with call bell/phone within reach;with chair alarm set;with family/visitor present Nurse Communication: Mobility status PT Visit Diagnosis: Unsteadiness on feet (R26.81);Muscle weakness (generalized) (M62.81);Ataxic gait (R26.0)     Time: 1000-1034 PT Time Calculation (min) (ACUTE ONLY): 34 min  Charges:  $Gait Training: 8-22 mins                     Grabiela Wohlford A. Dan Humphreys PT, DPT Acute Rehabilitation Services Pager [phone] Office [phone]    Viviann Spare 10/08/2021, 12:11 PM

## 2021-10-09 MED ORDER — FUROSEMIDE 10 MG/ML IJ SOLN
20.0000 mg | Freq: Once | INTRAMUSCULAR | Status: AC
  Administered 2021-10-09: 10:00:00 20 mg via INTRAVENOUS
  Filled 2021-10-09: qty 2

## 2021-10-09 NOTE — Progress Notes (Signed)
D/c was delayed for decreased O2 sats, CXR showed pulmonary edema or vascular congestion. Gave one dose lasix yesterday, will repeat this am. If sats ok off o2 may d/c home later today. We will monitor

## 2021-10-09 NOTE — Progress Notes (Signed)
Physical Therapy Treatment Patient Details Name: Marcus James MRN: [id number] DOB: 1966/08/11 Today's Date: 10/09/2021   History of Present Illness 55 yo admitted with abnormal gait and numbness, pain and weakness in hands and legs, Imaging showed near cervical spinal stenosis with signal change in the spinal cord C4-C6 and a subluxation of C7-T1/cervical stenosis with cervical spondylitic myelopathy. Pt complains of 3-week duration of cough, generalized body aches. Underwent decompressive cervical laninectomy, medial foraminotomies c$-C5-C6, foraminotomies performed C7-T1; Segmental fixation C3-C7.    PT Comments    Pt demonstrates mod I bed mobility. Supervision for transfers and ambulation 180' without AD. Pt in recliner at end of session.  SpO2 94% at rest on RA. Desat to 87% during amb. Quick recovery to 90% with seated rest break. Pt scheduled to receive 2nd dose of lasix this AM.    Recommendations for follow up therapy are one component of a multi-disciplinary discharge planning process, led by the attending physician.  Recommendations may be updated based on patient status, additional functional criteria and insurance authorization.  Follow Up Recommendations  Outpatient PT (neuro)     Assistance Recommended at Discharge Frequent or constant Supervision/Assistance  Equipment Recommendations  Rolling walker (2 wheels) (for community)    Recommendations for Other Services       Precautions / Restrictions Precautions Precautions: Fall;Cervical;Other (comment) Precaution Comments: Monitor O2 Required Braces or Orthoses: Cervical Brace Cervical Brace: Soft collar     Mobility  Bed Mobility Overal bed mobility: Modified Independent             General bed mobility comments: increased time    Transfers Overall transfer level: Needs assistance Equipment used: None Transfers: Sit to/from Stand;Bed to chair/wheelchair/BSC Sit to Stand: Supervision     Step  pivot transfers: Supervision     General transfer comment: supervision for safety    Ambulation/Gait Ambulation/Gait assistance: Supervision Gait Distance (Feet): 180 Feet Assistive device: None Gait Pattern/deviations: Step-through pattern;Decreased stride length Gait velocity: mildly decreased Gait velocity interpretation: 1.31 - 2.62 ft/sec, indicative of limited community ambulator   General Gait Details: Amb on RA with desat to 87%. Quick return to 90% upon seated rest break.   Stairs             Wheelchair Mobility    Modified Rankin (Stroke Patients Only)       Balance Overall balance assessment: History of Falls;Needs assistance Sitting-balance support: No upper extremity supported;Feet supported Sitting balance-Leahy Scale: Good     Standing balance support: No upper extremity supported;During functional activity Standing balance-Leahy Scale: Fair                              Cognition Arousal/Alertness: Awake/alert Behavior During Therapy: WFL for tasks assessed/performed Overall Cognitive Status: Within Functional Limits for tasks assessed                                          Exercises      General Comments General comments (skin integrity, edema, etc.): Pt on RA throughout session. SpO2 94% at rest. Desat to 87% during amb. Quick recovery to 90% with seated rest break.      Pertinent Vitals/Pain Pain Assessment: Faces Faces Pain Scale: Hurts a little bit Pain Location: neck Pain Descriptors / Indicators: Discomfort;Grimacing Pain Intervention(s): Monitored during session;Repositioned    Home Living  Prior Function            PT Goals (current goals can now be found in the care plan section) Acute Rehab PT Goals Patient Stated Goal: home Progress towards PT goals: Progressing toward goals    Frequency    Min 5X/week      PT Plan Current plan remains  appropriate    Co-evaluation              AM-PAC PT "6 Clicks" Mobility   Outcome Measure  Help needed turning from your back to your side while in a flat bed without using bedrails?: None Help needed moving from lying on your back to sitting on the side of a flat bed without using bedrails?: A Little Help needed moving to and from a bed to a chair (including a wheelchair)?: A Little Help needed standing up from a chair using your arms (e.g., wheelchair or bedside chair)?: A Little Help needed to walk in hospital room?: A Little Help needed climbing 3-5 steps with a railing? : A Little 6 Click Score: 19    End of Session Equipment Utilized During Treatment: Gait belt Activity Tolerance: Patient tolerated treatment well Patient left: in chair;with call bell/phone within reach;with chair alarm set;with family/visitor present Nurse Communication: Mobility status PT Visit Diagnosis: Unsteadiness on feet (R26.81);Muscle weakness (generalized) (M62.81);Ataxic gait (R26.0)     Time: 2778-2423 PT Time Calculation (min) (ACUTE ONLY): 14 min  Charges:  $Gait Training: 8-22 mins                     Aida Raider, PT  Office # [phone] Pager [phone]    Ilda Foil 10/09/2021, 8:55 AM

## 2021-10-09 NOTE — Progress Notes (Signed)
NEUROSURGERY PROGRESS NOTE  Doing well. Complains of appropriate neck soreness. Had an issue with ambulating and his sats dropping yesterday. He was given lasix yesterday and a dose this morning. Will see how he does today and if he can ambulate without oxygen he can go home.   Temp:  [98.2 F (36.8 C)-98.6 F (37 C)] 98.2 F (36.8 C) (12/18 0803) Pulse Rate:  [65-95] 65 (12/18 0803) Resp:  [18-20] 20 (12/18 0323) BP: (147-181)/(77-93) 159/77 (12/18 0803) SpO2:  [92 %-96 %] 92 % (12/18 0803)   Sherryl Manges, NP 10/09/2021 8:13 AM

## 2021-10-25 ENCOUNTER — Telehealth: Payer: Self-pay | Admitting: Rehabilitation

## 2021-10-25 ENCOUNTER — Ambulatory Visit: Payer: Medicaid Other | Attending: Neurological Surgery | Admitting: Rehabilitation

## 2021-10-25 ENCOUNTER — Encounter: Payer: Self-pay | Admitting: Rehabilitation

## 2021-10-25 ENCOUNTER — Other Ambulatory Visit: Payer: Self-pay

## 2021-10-25 DIAGNOSIS — R531 Weakness: Secondary | ICD-10-CM | POA: Diagnosis not present

## 2021-10-25 DIAGNOSIS — R293 Abnormal posture: Secondary | ICD-10-CM | POA: Diagnosis present

## 2021-10-25 DIAGNOSIS — R208 Other disturbances of skin sensation: Secondary | ICD-10-CM | POA: Diagnosis present

## 2021-10-25 DIAGNOSIS — R29818 Other symptoms and signs involving the nervous system: Secondary | ICD-10-CM | POA: Diagnosis present

## 2021-10-25 DIAGNOSIS — R2689 Other abnormalities of gait and mobility: Secondary | ICD-10-CM | POA: Insufficient documentation

## 2021-10-25 DIAGNOSIS — R2681 Unsteadiness on feet: Secondary | ICD-10-CM | POA: Insufficient documentation

## 2021-10-25 DIAGNOSIS — M542 Cervicalgia: Secondary | ICD-10-CM | POA: Insufficient documentation

## 2021-10-25 DIAGNOSIS — M6281 Muscle weakness (generalized): Secondary | ICD-10-CM | POA: Insufficient documentation

## 2021-10-25 NOTE — Telephone Encounter (Signed)
Dr. Yetta Barre,   I have evaluated Marcus James here at [facility] for PT following decompressive surgery with you.  I feel that he would benefit greatly from an OT evaluation as he has marked deficits in B UEs vs LEs.    I would also like to know if he has any restrictions post surgery?? Can he remove soft collar (if so, when?), ROM?, lifting restrictions?  Thanks,  Harriet Butte, PT, MPT [facility] [address] [place], [place], [address] Phone: [phone]   Fax:  [phone] 10/25/21, 3:18 PM
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
## 2021-11-01 ENCOUNTER — Ambulatory Visit: Payer: Medicaid Other | Admitting: Physical Therapy

## 2021-11-08 ENCOUNTER — Other Ambulatory Visit: Payer: Self-pay

## 2021-11-08 ENCOUNTER — Ambulatory Visit: Payer: Medicaid Other | Admitting: Rehabilitation

## 2021-11-08 ENCOUNTER — Encounter: Payer: Self-pay | Admitting: Rehabilitation

## 2021-11-08 DIAGNOSIS — R293 Abnormal posture: Secondary | ICD-10-CM

## 2021-11-08 DIAGNOSIS — M6281 Muscle weakness (generalized): Secondary | ICD-10-CM

## 2021-11-08 DIAGNOSIS — R2689 Other abnormalities of gait and mobility: Secondary | ICD-10-CM

## 2021-11-08 DIAGNOSIS — M542 Cervicalgia: Secondary | ICD-10-CM

## 2021-11-08 DIAGNOSIS — R2681 Unsteadiness on feet: Secondary | ICD-10-CM | POA: Diagnosis not present

## 2021-11-08 NOTE — Patient Instructions (Signed)
Access Code: [email or web address] URL: [email or web address] Date: 11/08/2021 Prepared by: Harriet Butte  Exercises Seated Cervical Sidebending Stretch - 2 x daily - 7 x weekly - 1 sets - 3 reps - 20-30 secs hold Gentle Levator Scapulae Stretch - 2 x daily - 7 x weekly - 1 sets - 3 reps - 20-30 secs hold Seated Cervical Rotation AROM - 2 x daily - 7 x weekly - 1 sets - 10 reps Seated Cervical Flexion AROM - 2 x daily - 7 x weekly - 1 sets - 10 reps Romberg Stance Eyes Closed on Foam Pad - 1 x daily - 7 x weekly - 3 sets - 10 reps
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
## 2021-11-15 ENCOUNTER — Encounter: Payer: Self-pay | Admitting: Occupational Therapy

## 2021-11-15 ENCOUNTER — Ambulatory Visit: Payer: Medicaid Other | Admitting: Physical Therapy

## 2021-11-15 ENCOUNTER — Ambulatory Visit: Payer: Medicaid Other | Admitting: Occupational Therapy

## 2021-11-15 ENCOUNTER — Other Ambulatory Visit: Payer: Self-pay

## 2021-11-15 ENCOUNTER — Encounter: Payer: Self-pay | Admitting: Physical Therapy

## 2021-11-15 DIAGNOSIS — M6281 Muscle weakness (generalized): Secondary | ICD-10-CM

## 2021-11-15 DIAGNOSIS — R2681 Unsteadiness on feet: Secondary | ICD-10-CM

## 2021-11-15 DIAGNOSIS — R2689 Other abnormalities of gait and mobility: Secondary | ICD-10-CM

## 2021-11-15 DIAGNOSIS — R208 Other disturbances of skin sensation: Secondary | ICD-10-CM

## 2021-11-15 DIAGNOSIS — R29818 Other symptoms and signs involving the nervous system: Secondary | ICD-10-CM
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
## 2021-11-22 ENCOUNTER — Ambulatory Visit: Payer: Medicaid Other | Admitting: Occupational Therapy

## 2021-11-28 ENCOUNTER — Ambulatory Visit: Payer: Medicaid Other | Admitting: Occupational Therapy

## 2021-11-29 ENCOUNTER — Other Ambulatory Visit: Payer: Self-pay

## 2021-11-29 ENCOUNTER — Other Ambulatory Visit (HOSPITAL_COMMUNITY): Payer: Self-pay | Admitting: Student

## 2021-11-29 ENCOUNTER — Encounter: Payer: Self-pay | Admitting: Rehabilitation

## 2021-11-29 ENCOUNTER — Ambulatory Visit: Payer: Medicaid Other | Attending: Neurological Surgery | Admitting: Rehabilitation

## 2021-11-29 DIAGNOSIS — M6281 Muscle weakness (generalized): Secondary | ICD-10-CM | POA: Insufficient documentation

## 2021-11-29 DIAGNOSIS — R29818 Other symptoms and signs involving the nervous system: Secondary | ICD-10-CM | POA: Diagnosis present

## 2021-11-29 DIAGNOSIS — M542 Cervicalgia: Secondary | ICD-10-CM | POA: Diagnosis present

## 2021-11-29 DIAGNOSIS — R2681 Unsteadiness on feet: Secondary | ICD-10-CM | POA: Diagnosis present

## 2021-11-29 DIAGNOSIS — M5416 Radiculopathy, lumbar region: Secondary | ICD-10-CM

## 2021-11-29 NOTE — Patient Instructions (Addendum)
°  Access Code: [email or web address] URL: [email or web address] Date: 11/29/2021 Prepared by: Cameron Sprang  Exercises Seated Cervical Sidebending Stretch - 2 x daily - 7 x weekly - 1 sets - 3 reps - 20-30 secs hold Gentle Levator Scapulae Stretch - 2 x daily - 7 x weekly - 1 sets - 3 reps - 20-30 secs hold Seated Cervical Rotation AROM - 1 x daily - 7 x weekly - 1 sets - 10 reps Seated Cervical Retraction - 1 x daily - 7 x weekly - 1 sets - 10 reps - 5 secs hold Romberg Stance with Head Nods on Foam Pad - 1 x daily - 7 x weekly - 3 sets - 10 reps Romberg Stance on Foam Pad with Head Rotation - 1 x daily - 7 x weekly - 3 sets - 10 reps
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
## 2021-12-01 ENCOUNTER — Other Ambulatory Visit: Payer: Self-pay

## 2021-12-01 ENCOUNTER — Encounter: Payer: Self-pay | Admitting: Physical Therapy

## 2021-12-01 ENCOUNTER — Ambulatory Visit: Payer: Medicaid Other | Admitting: Physical Therapy

## 2021-12-01 DIAGNOSIS — M6281 Muscle weakness (generalized): Secondary | ICD-10-CM | POA: Diagnosis not present

## 2021-12-01 DIAGNOSIS — R2681 Unsteadiness on feet: Secondary | ICD-10-CM

## 2021-12-01 DIAGNOSIS — R29818 Other symptoms and signs involving the nervous system: Secondary | ICD-10-CM

## 2021-12-01 DIAGNOSIS — M542 Cervicalgia: Secondary | ICD-10-CM
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
## 2021-12-06 ENCOUNTER — Ambulatory Visit: Payer: Medicaid Other | Admitting: Physical Therapy

## 2021-12-06 ENCOUNTER — Ambulatory Visit: Payer: Medicaid Other | Admitting: Rehabilitation

## 2021-12-06 ENCOUNTER — Ambulatory Visit: Payer: Medicaid Other | Admitting: Occupational Therapy

## 2021-12-08 ENCOUNTER — Other Ambulatory Visit: Payer: Self-pay

## 2021-12-08 ENCOUNTER — Encounter: Payer: Self-pay | Admitting: Physical Therapy

## 2021-12-08 ENCOUNTER — Ambulatory Visit: Payer: Medicaid Other | Admitting: Physical Therapy

## 2021-12-08 DIAGNOSIS — M542 Cervicalgia: Secondary | ICD-10-CM

## 2021-12-08 DIAGNOSIS — M6281 Muscle weakness (generalized): Secondary | ICD-10-CM
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
## 2021-12-09 ENCOUNTER — Other Ambulatory Visit (HOSPITAL_COMMUNITY): Payer: Self-pay

## 2021-12-09 MED ORDER — GABAPENTIN 100 MG PO CAPS
100.0000 mg | ORAL_CAPSULE | Freq: Every day | ORAL | 1 refills | Status: DC
  Filled 2021-12-09: qty 90, 90d supply, fill #0

## 2021-12-09 MED ORDER — MIRTAZAPINE 7.5 MG PO TABS
7.5000 mg | ORAL_TABLET | Freq: Every day | ORAL | 0 refills | Status: DC
  Filled 2021-12-09: qty 30, 30d supply, fill #0

## 2021-12-09 MED ORDER — METFORMIN HCL 1000 MG PO TABS
1000.0000 mg | ORAL_TABLET | Freq: Two times a day (BID) | ORAL | 3 refills | Status: DC
  Filled 2021-12-09: qty 180, 90d supply, fill #0
  Filled 2022-06-06: qty 180, 90d supply, fill #1

## 2021-12-10 ENCOUNTER — Ambulatory Visit (HOSPITAL_COMMUNITY)
Admission: RE | Admit: 2021-12-10 | Discharge: 2021-12-10 | Disposition: A | Discharge status: Home / Self Care | Payer: Medicaid Other | Source: Ambulatory Visit | Attending: Student | Admitting: Student

## 2021-12-10 DIAGNOSIS — M5416 Radiculopathy, lumbar region: Secondary | ICD-10-CM | POA: Diagnosis present

## 2021-12-10 IMAGING — MR MR LUMBAR SPINE W/O CM
4 of 5 series · 30 of 48 positions shown · non-contrast
Comparison: Radiography [DATE]

CLINICAL DATA: Chronic lumbar region back pain. Difficulty walking.

EXAM:
MRI LUMBAR SPINE WITHOUT CONTRAST
TECHNIQUE: Multiplanar, multisequence MR imaging of the lumbar spine was
performed. No intravenous contrast was administered.

[Series 5: T1 · sagittal · 4.0mm · 0.81mm/px · 6 of 17 slices shown (1 of 2)]
[im 1/17]
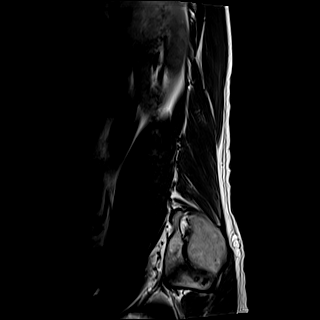
[im 4/17]
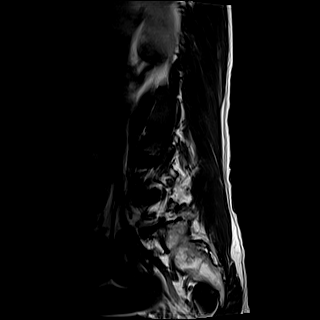
[im 7/17]
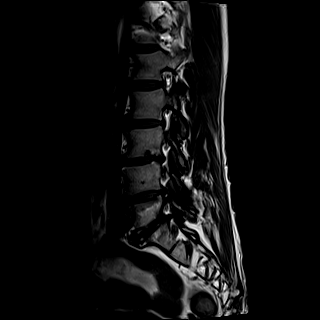
[im 10/17]
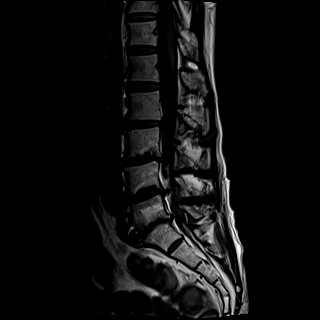
[im 13/17]
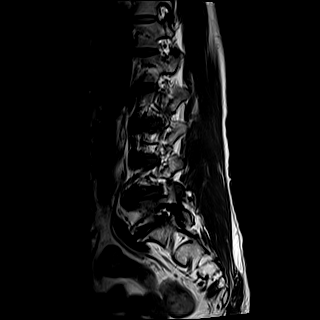
[im 17/17]
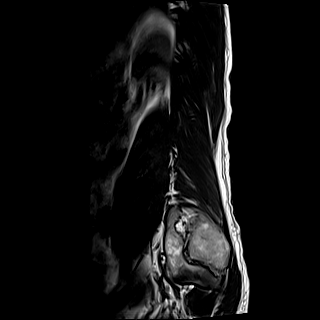

[Series 6: T2 · sagittal · 4.0mm · 0.81mm/px · 6 of 17 slices shown (1 of 2)]
[im 1/17]
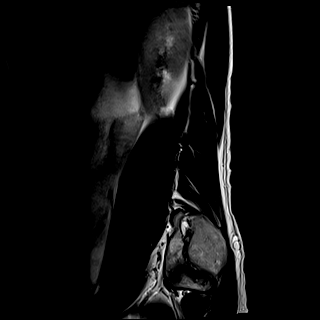
[im 4/17]
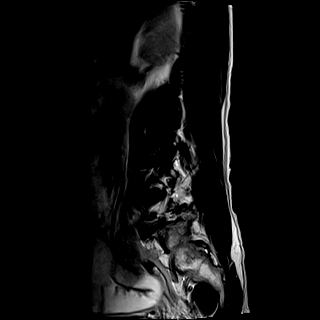
[im 7/17]
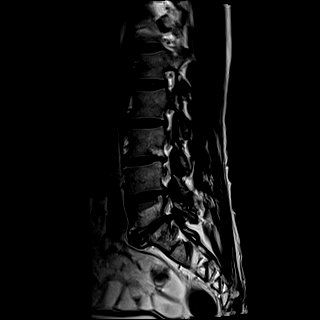
[im 10/17]
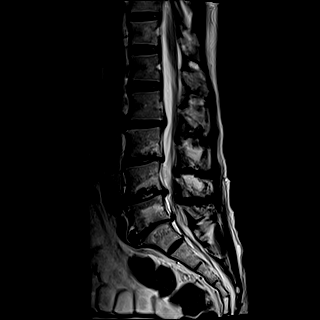
[im 13/17]
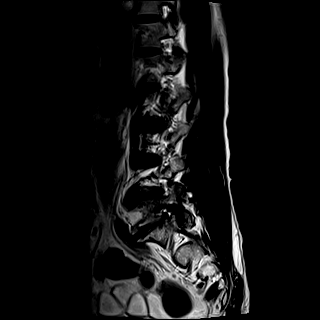
[im 17/17]
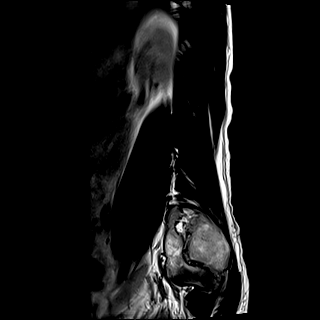

[Series 8: T2 · axial · 4.0mm · 0.62mm/px · z∈[-76,+118]mm · 9 of 40 slices shown (2 of 2)]
[im 1/40]
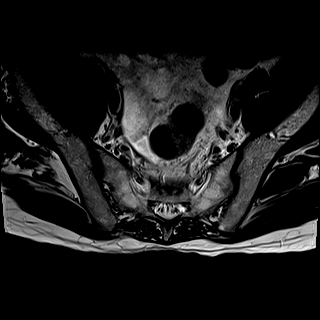
[im 6/40]
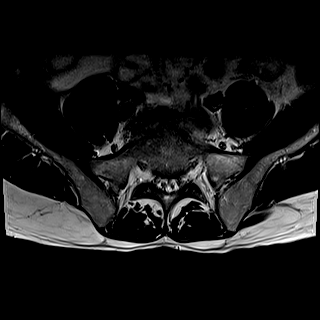
[im 12/40]
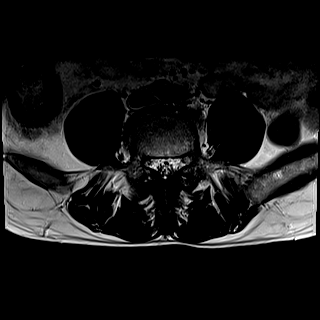
[im 17/40]
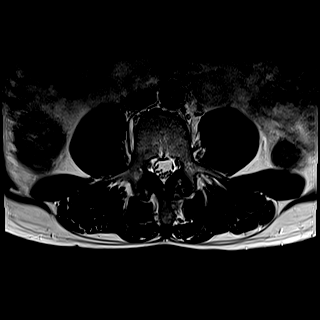
[im 20/40]
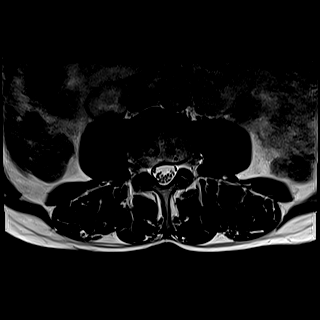
[im 23/40]
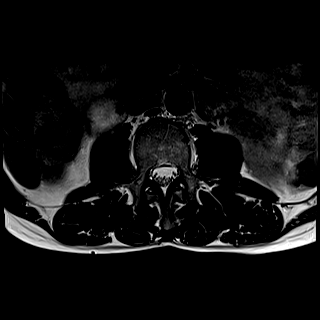
[im 28/40]
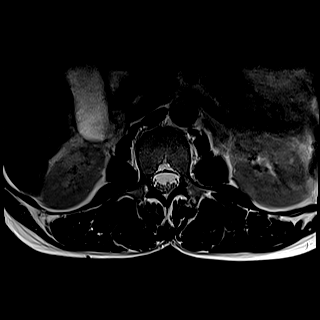
[im 34/40]
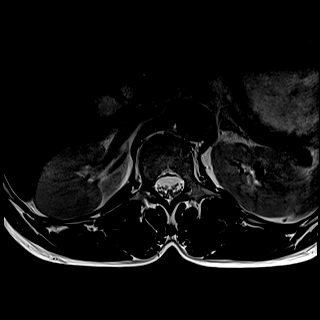
[im 40/40]
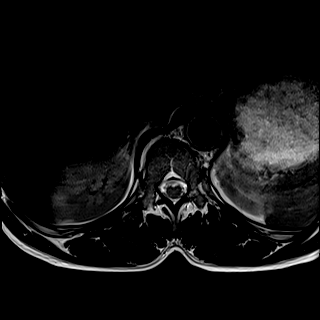

[Series 9: T1 · axial · 4.0mm · 0.39mm/px · z∈[-76,+118]mm · 9 of 40 slices shown (2 of 2)]
[im 1/40]
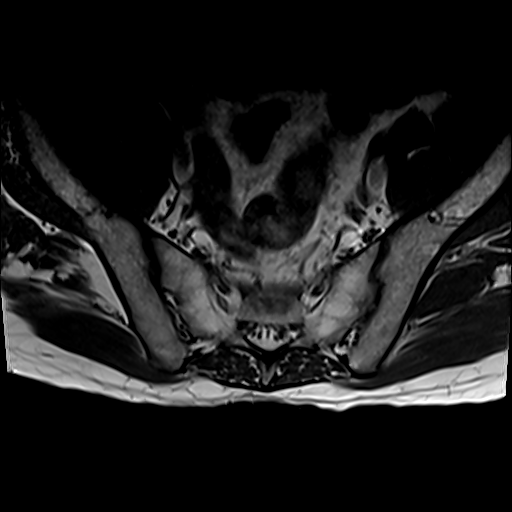
[im 6/40]
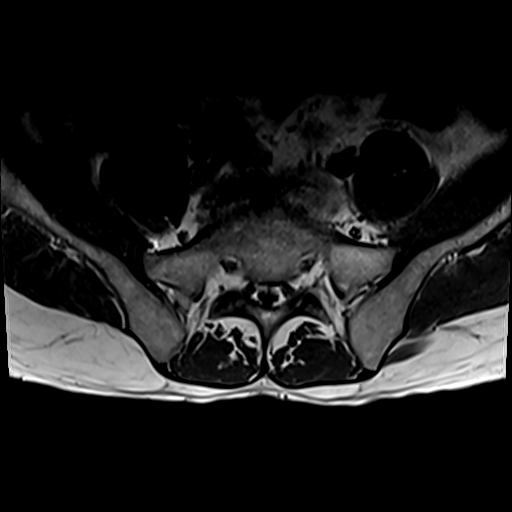
[im 12/40]
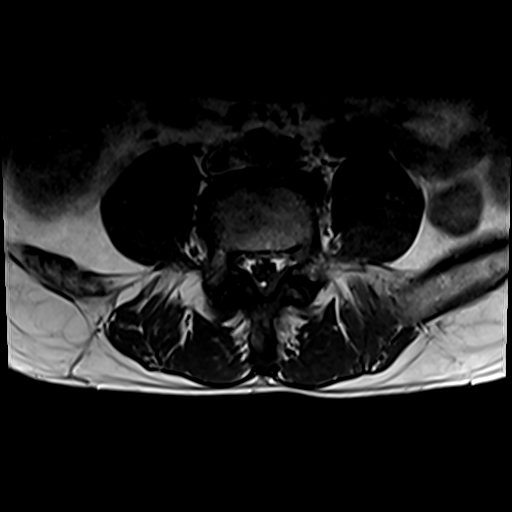
[im 17/40]
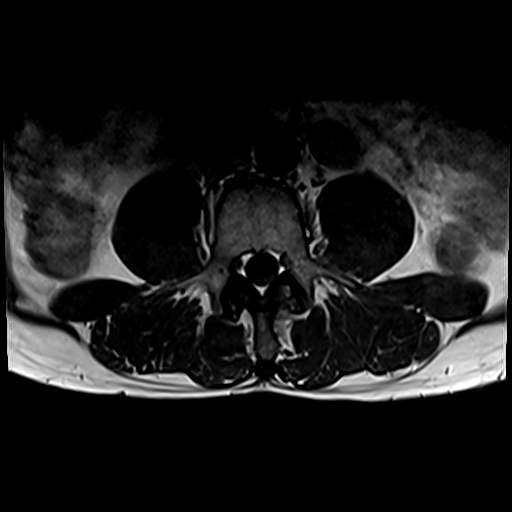
[im 20/40]
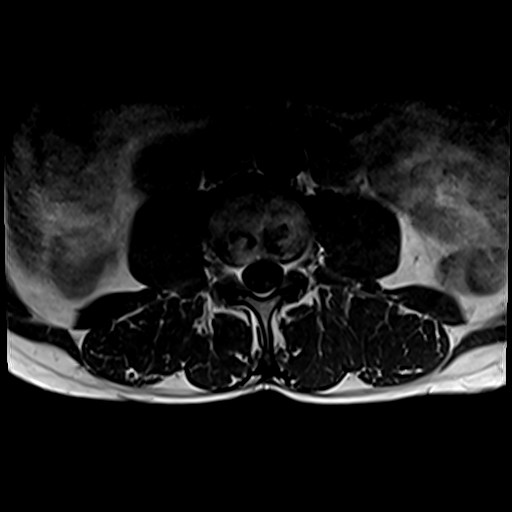
[im 23/40]
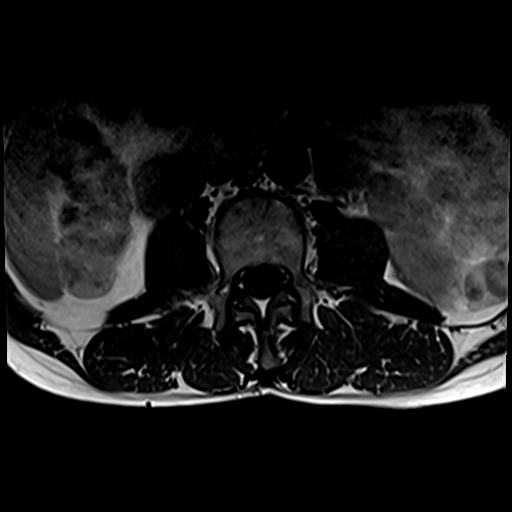
[im 28/40]
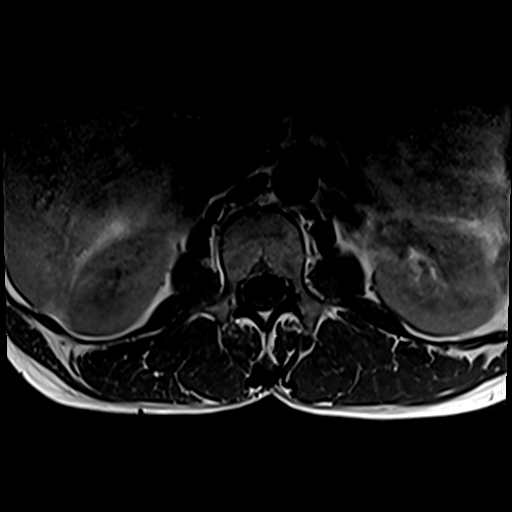
[im 34/40]
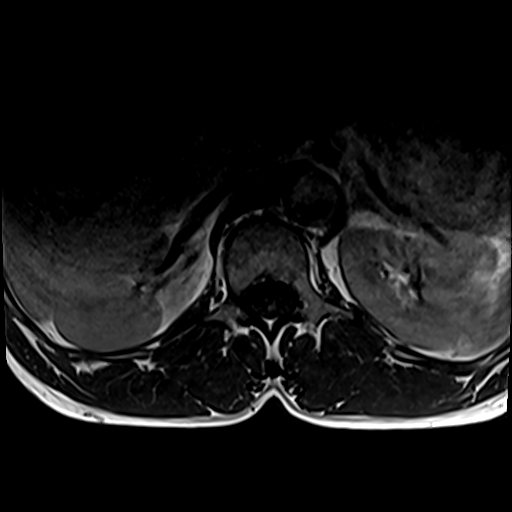
[im 40/40]
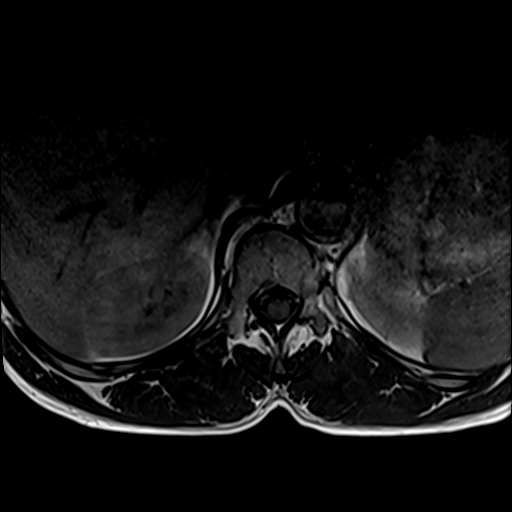

[30 of 48 positions shown; findings below may reference images not displayed]

FINDINGS: Segmentation:  5 lumbar type vertebral bodies.

Alignment:  Straightening of the normal lumbar lordosis.

Vertebrae: No fracture or focal bone lesion. Chronic appearing
discogenic endplate marrow changes at L3-4, L4-5 and L5-S1.

Conus medullaris and cauda equina: Conus extends to the L1 level.
Conus and cauda equina appear normal.

Paraspinal and other soft tissues: Negative

Disc levels:

T11-12, T12-L1 and L1-2: Normal

L2-3: Mild bulging of the disc. Mild facet degeneration. No
compressive stenosis.

L3-4: Disc degeneration with loss of disc height, endplate
osteophytes, mild bulging of the disc and chronic endplate marrow
changes. Mild facet and ligamentous prominence. No compressive
narrowing of the canal or foramina.

L4-5: Disc degeneration with loss of disc height, endplate
osteophytes, mild bulging of the disc and chronic endplate marrow
changes. Mild facet and ligamentous hypertrophy. Mild stenosis of
the lateral recesses and neural foramina, right more than left.
Definite neural compression is not established however.

L5-S1: Disc degeneration with loss of disc height, endplate
osteophytes, mild bulging of the disc and chronic endplate marrow
changes. Mild facet and ligamentous hypertrophy. Mild stenosis of
the subarticular lateral recesses. Moderate bilateral foraminal
stenosis. There is particular potential for L5 nerve compression in
the foramina.
IMPRESSION: Chronic degenerative disc disease at L3-4, L4-5 and L5-S1. Mild
lower lumbar facet osteoarthritis. The findings in general could
contribute to low back pain. No neural compression is seen at L3-4.
Mild lateral recess and foraminal stenosis right more than left at
L4-5 but without definite neural compression. Subarticular lateral
recess and foraminal stenosis at L5-S1, with potential for
compression of the exiting L5 nerves.

## 2021-12-12 ENCOUNTER — Other Ambulatory Visit (HOSPITAL_COMMUNITY): Payer: Self-pay

## 2021-12-12 MED ORDER — METRONIDAZOLE 250 MG PO TABS
250.0000 mg | ORAL_TABLET | Freq: Three times a day (TID) | ORAL | 0 refills | Status: DC
  Filled 2021-12-12: qty 56, 14d supply, fill #0

## 2021-12-12 MED ORDER — PEPTO-BISMOL 262 MG PO TABS: 262.0000 mg | ORAL_TABLET | Freq: Four times a day (QID) | ORAL | 0 refills | Status: DC

## 2021-12-12 MED ORDER — OMEPRAZOLE 20 MG PO CPDR
20.0000 mg | DELAYED_RELEASE_CAPSULE | Freq: Two times a day (BID) | ORAL | 0 refills | Status: DC
  Filled 2021-12-12: qty 28, 14d supply, fill #0

## 2021-12-12 MED ORDER — CLARITHROMYCIN 500 MG PO TABS
500.0000 mg | ORAL_TABLET | Freq: Two times a day (BID) | ORAL | 0 refills | Status: DC
  Filled 2021-12-12: qty 28, 14d supply, fill #0

## 2021-12-13 ENCOUNTER — Ambulatory Visit: Payer: Medicaid Other | Admitting: Rehabilitation

## 2021-12-13 ENCOUNTER — Ambulatory Visit: Payer: Medicaid Other | Admitting: Occupational Therapy

## 2021-12-14 ENCOUNTER — Other Ambulatory Visit (HOSPITAL_COMMUNITY): Payer: Self-pay

## 2021-12-15 ENCOUNTER — Ambulatory Visit: Payer: Medicaid Other | Admitting: Physical Therapy

## 2021-12-20 ENCOUNTER — Ambulatory Visit: Payer: Medicaid Other | Admitting: Occupational Therapy

## 2021-12-20 ENCOUNTER — Ambulatory Visit: Payer: Medicaid Other | Admitting: Rehabilitation

## 2021-12-22 ENCOUNTER — Ambulatory Visit: Payer: Medicaid Other | Admitting: Physical Therapy

## 2021-12-27 ENCOUNTER — Ambulatory Visit: Payer: Medicaid Other | Admitting: Physical Therapy

## 2021-12-27 ENCOUNTER — Ambulatory Visit: Payer: Medicaid Other | Admitting: Occupational Therapy

## 2021-12-29 ENCOUNTER — Ambulatory Visit: Payer: Medicaid Other

## 2022-01-03 ENCOUNTER — Ambulatory Visit: Payer: Medicaid Other | Admitting: Physical Therapy

## 2022-01-05 ENCOUNTER — Other Ambulatory Visit (HOSPITAL_COMMUNITY): Payer: Self-pay

## 2022-01-05 ENCOUNTER — Ambulatory Visit: Payer: Medicaid Other

## 2022-01-06 ENCOUNTER — Other Ambulatory Visit (HOSPITAL_COMMUNITY): Payer: Self-pay

## 2022-01-06 ENCOUNTER — Ambulatory Visit: Payer: Medicaid Other

## 2022-01-10 ENCOUNTER — Other Ambulatory Visit (HOSPITAL_COMMUNITY): Payer: Self-pay

## 2022-01-11 ENCOUNTER — Other Ambulatory Visit (HOSPITAL_COMMUNITY): Payer: Self-pay

## 2022-01-11 MED ORDER — OMEPRAZOLE 20 MG PO CPDR
20.0000 mg | DELAYED_RELEASE_CAPSULE | Freq: Every day | ORAL | 0 refills | Status: DC
  Filled 2022-01-11: qty 90, 90d supply, fill #0

## 2022-01-19 ENCOUNTER — Other Ambulatory Visit (HOSPITAL_COMMUNITY): Payer: Self-pay

## 2022-01-30 ENCOUNTER — Other Ambulatory Visit (HOSPITAL_COMMUNITY): Payer: Self-pay

## 2022-01-30 MED ORDER — ACETAMINOPHEN EXTRA STRENGTH 500 MG PO CAPS: 1000.0000 mg | ORAL_CAPSULE | Freq: Four times a day (QID) | ORAL | 3 refills | Status: DC | PRN

## 2022-01-30 MED ORDER — MIRTAZAPINE 7.5 MG PO TABS
7.5000 mg | ORAL_TABLET | Freq: Every day | ORAL | 3 refills | Status: AC
  Filled 2022-01-30: qty 90, 90d supply, fill #0
  Filled 2022-06-06: qty 90, 90d supply, fill #1
  Filled 2022-12-12 (×2): qty 90, 90d supply, fill #2

## 2022-01-30 MED ORDER — LISINOPRIL 5 MG PO TABS
5.0000 mg | ORAL_TABLET | Freq: Every day | ORAL | 0 refills | Status: DC
  Filled 2022-01-30: qty 30, 30d supply, fill #0

## 2022-02-06 ENCOUNTER — Other Ambulatory Visit (HOSPITAL_COMMUNITY): Payer: Self-pay

## 2022-02-06 MED ORDER — LISINOPRIL 5 MG PO TABS
5.0000 mg | ORAL_TABLET | Freq: Every day | ORAL | 3 refills | Status: DC
  Filled 2022-02-06: qty 90, 90d supply, fill #0
  Filled 2022-03-27: qty 30, 30d supply, fill #1
  Filled 2022-04-12: qty 30, 30d supply, fill #2
  Filled 2022-06-06: qty 30, 30d supply, fill #3
  Filled 2022-08-18: qty 30, 30d supply, fill #4
  Filled 2022-11-16 (×2): qty 30, 30d supply, fill #5
  Filled 2022-12-12 (×2): qty 30, 30d supply, fill #6

## 2022-02-06 MED ORDER — GABAPENTIN 300 MG PO CAPS
300.0000 mg | ORAL_CAPSULE | Freq: Every day | ORAL | 3 refills | Status: DC
  Filled 2022-02-06 – 2022-02-21 (×3): qty 90, 90d supply, fill #0
  Filled 2022-04-12 – 2022-11-16 (×3): qty 90, 90d supply, fill #1

## 2022-02-06 MED ORDER — GABAPENTIN 300 MG PO CAPS
300.0000 mg | ORAL_CAPSULE | Freq: Every evening | ORAL | 3 refills | Status: DC
  Filled 2022-02-06: qty 90, 90d supply, fill #0

## 2022-02-06 MED ORDER — CETIRIZINE HCL 10 MG PO TABS
10.0000 mg | ORAL_TABLET | Freq: Every day | ORAL | 0 refills | Status: DC
  Filled 2022-02-06: qty 30, 30d supply, fill #0

## 2022-02-06 MED ORDER — METHOCARBAMOL 500 MG PO TABS
1000.0000 mg | ORAL_TABLET | Freq: Four times a day (QID) | ORAL | 0 refills | Status: DC
  Filled 2022-02-06: qty 240, 30d supply, fill #0

## 2022-02-14 ENCOUNTER — Other Ambulatory Visit (HOSPITAL_COMMUNITY): Payer: Self-pay

## 2022-02-15 ENCOUNTER — Other Ambulatory Visit (HOSPITAL_COMMUNITY): Payer: Self-pay

## 2022-02-15 MED ORDER — OMEPRAZOLE 20 MG PO CPDR
20.0000 mg | DELAYED_RELEASE_CAPSULE | Freq: Every day | ORAL | 3 refills | Status: DC
  Filled 2022-02-15: qty 90, 90d supply, fill #0
  Filled 2022-03-27: qty 30, 30d supply, fill #1
  Filled 2022-06-06: qty 30, 30d supply, fill #2
  Filled 2022-08-18: qty 30, 30d supply, fill #3
  Filled 2022-10-04: qty 30, 30d supply, fill #4

## 2022-02-21 ENCOUNTER — Other Ambulatory Visit (HOSPITAL_COMMUNITY): Payer: Self-pay

## 2022-02-27 ENCOUNTER — Other Ambulatory Visit (HOSPITAL_COMMUNITY): Payer: Self-pay

## 2022-03-08 ENCOUNTER — Other Ambulatory Visit (HOSPITAL_COMMUNITY): Payer: Self-pay

## 2022-03-13 ENCOUNTER — Other Ambulatory Visit (HOSPITAL_COMMUNITY): Payer: Self-pay

## 2022-03-15 ENCOUNTER — Other Ambulatory Visit (HOSPITAL_COMMUNITY): Payer: Self-pay

## 2022-03-15 MED ORDER — METHOCARBAMOL 500 MG PO TABS
1000.0000 mg | ORAL_TABLET | Freq: Four times a day (QID) | ORAL | 7 refills | Status: DC | PRN
Start: 2022-03-15 — End: 2022-03-16
  Filled 2022-03-15: qty 240, 60d supply, fill #0

## 2022-03-16 ENCOUNTER — Other Ambulatory Visit (HOSPITAL_COMMUNITY): Payer: Self-pay

## 2022-03-16 MED ORDER — ATORVASTATIN CALCIUM 20 MG PO TABS
20.0000 mg | ORAL_TABLET | Freq: Every day | ORAL | 3 refills | Status: DC
  Filled 2022-03-16: qty 90, 90d supply, fill #0
  Filled 2022-06-06: qty 90, 90d supply, fill #1
  Filled 2022-12-12 (×2): qty 90, 90d supply, fill #2

## 2022-03-16 MED ORDER — METHOCARBAMOL 500 MG PO TABS
1000.0000 mg | ORAL_TABLET | Freq: Four times a day (QID) | ORAL | 7 refills | Status: DC | PRN
Start: 2022-03-16 — End: 2023-05-03
  Filled 2022-03-16: qty 240, 30d supply, fill #0
  Filled 2022-06-06: qty 240, 30d supply, fill #1
  Filled 2022-10-04: qty 240, 30d supply, fill #2
  Filled 2022-12-12 (×2): qty 240, 30d supply, fill #3

## 2022-03-16 MED ORDER — NICOTINE 14 MG/24HR TD PT24
14.0000 mg | MEDICATED_PATCH | Freq: Every day | TRANSDERMAL | 0 refills | Status: DC
  Filled 2022-03-16: qty 28, 28d supply, fill #0

## 2022-03-17 ENCOUNTER — Other Ambulatory Visit (HOSPITAL_COMMUNITY): Payer: Self-pay

## 2022-03-17 MED ORDER — NICOTINE 7 MG/24HR TD PT24: 7.0000 mg | MEDICATED_PATCH | Freq: Every day | TRANSDERMAL | 0 refills | Status: DC

## 2022-03-21 ENCOUNTER — Other Ambulatory Visit (HOSPITAL_COMMUNITY): Payer: Self-pay

## 2022-03-27 ENCOUNTER — Other Ambulatory Visit (HOSPITAL_COMMUNITY): Payer: Self-pay

## 2022-04-12 ENCOUNTER — Other Ambulatory Visit (HOSPITAL_COMMUNITY): Payer: Self-pay

## 2022-04-14 ENCOUNTER — Other Ambulatory Visit (HOSPITAL_COMMUNITY): Payer: Self-pay

## 2022-05-09 ENCOUNTER — Other Ambulatory Visit (HOSPITAL_COMMUNITY): Payer: Self-pay

## 2022-05-09 MED ORDER — PANTOPRAZOLE SODIUM 40 MG PO TBEC
40.0000 mg | DELAYED_RELEASE_TABLET | Freq: Every day | ORAL | 0 refills | Status: DC
  Filled 2022-05-09: qty 30, 30d supply, fill #0
  Filled 2022-06-06: qty 30, 30d supply, fill #1
  Filled 2022-12-12 (×2): qty 30, 30d supply, fill #2

## 2022-05-09 MED ORDER — GABAPENTIN 300 MG PO CAPS
300.0000 mg | ORAL_CAPSULE | Freq: Every day | ORAL | 0 refills | Status: DC
  Filled 2022-05-09 – 2022-05-17 (×3): qty 90, 90d supply, fill #0

## 2022-05-17 ENCOUNTER — Other Ambulatory Visit (HOSPITAL_COMMUNITY): Payer: Self-pay

## 2022-05-17 MED ORDER — HYOSCYAMINE SULFATE ER 0.375 MG PO TB12
0.3750 mg | ORAL_TABLET | Freq: Two times a day (BID) | ORAL | 1 refills | Status: DC | PRN
  Filled 2022-05-17: qty 30, 15d supply, fill #0
  Filled 2022-06-06: qty 30, 15d supply, fill #1

## 2022-06-06 ENCOUNTER — Other Ambulatory Visit (HOSPITAL_COMMUNITY): Payer: Self-pay

## 2022-06-07 ENCOUNTER — Other Ambulatory Visit: Payer: Self-pay

## 2022-06-07 ENCOUNTER — Emergency Department (HOSPITAL_COMMUNITY)
Admission: EM | Admit: 2022-06-07 | Discharge: 2022-06-07 | Disposition: A | Discharge status: Home / Self Care | Payer: Medicaid Other | Attending: Emergency Medicine | Admitting: Emergency Medicine

## 2022-06-07 ENCOUNTER — Encounter (HOSPITAL_COMMUNITY): Payer: Self-pay | Admitting: Emergency Medicine

## 2022-06-07 ENCOUNTER — Other Ambulatory Visit (HOSPITAL_COMMUNITY): Payer: Self-pay

## 2022-06-07 DIAGNOSIS — K029 Dental caries, unspecified: Secondary | ICD-10-CM | POA: Insufficient documentation

## 2022-06-07 DIAGNOSIS — I10 Essential (primary) hypertension: Secondary | ICD-10-CM | POA: Diagnosis not present

## 2022-06-07 DIAGNOSIS — K047 Periapical abscess without sinus: Secondary | ICD-10-CM | POA: Diagnosis not present

## 2022-06-07 DIAGNOSIS — K0889 Other specified disorders of teeth and supporting structures: Secondary | ICD-10-CM | POA: Diagnosis present

## 2022-06-07 MED ORDER — OXYCODONE-ACETAMINOPHEN 5-325 MG PO TABS
1.0000 | ORAL_TABLET | Freq: Once | ORAL | Status: AC
  Administered 2022-06-07: 1 via ORAL
  Filled 2022-06-07: qty 1

## 2022-06-07 MED ORDER — AMOXICILLIN-POT CLAVULANATE 875-125 MG PO TABS
1.0000 | ORAL_TABLET | Freq: Once | ORAL | Status: AC
  Administered 2022-06-07: 1 via ORAL
  Filled 2022-06-07: qty 1

## 2022-06-07 MED ORDER — AMOXICILLIN-POT CLAVULANATE 875-125 MG PO TABS
1.0000 | ORAL_TABLET | Freq: Two times a day (BID) | ORAL | 0 refills | Status: AC
  Filled 2022-06-07: qty 20, 10d supply, fill #0

## 2022-06-07 NOTE — ED Notes (Signed)
Swahili interpreter used for d/c instructions. Patient verbalizes understanding of discharge instructions. Opportunity for questioning and answers were provided. Armband removed by staff, pt discharged from ED. Ambulated out to lobby

## 2022-06-07 NOTE — ED Triage Notes (Signed)
Swahili interpreter used during triage. Pt in with c/o R mouth pain x 2 days. Denies any fevers

## 2022-06-07 NOTE — Discharge Instructions (Addendum)
You were seen in the ER tonight for your dental pain. I am very sorry to see that you are hurting! Your pain should improve as we treat the dental infection. Please take entire course of antibiotics as directed.  Continue using ibuprofen / Tylenol, as well as prescribed Orajel for pain.  You will need to follow-up with your dentist for continued management of this. Please see dental resources below. Return to the emergency department for fevers, swelling or pain under the tongue or in the neck, difficulty breathing or swallowing, nausea or vomiting that does not stop, or any other new or concerning symptoms.

## 2022-06-07 NOTE — ED Provider Notes (Signed)
[facility] EMERGENCY DEPARTMENT Provider Note   CSN: [id number] Arrival date & time: 06/07/22  0435     History  Chief Complaint  Patient presents with   Dental Pain   History provided by the patient with swahili interpreter.   Marcus James is a 56 y.o. male who presents with concern for dental pain x 2 days on the right side, no fevers. NO N/V/D, no difficulty swallowing. History of dental infections and caries in the past, recently moved from [place].   I have reviewed his medical records. He has history of myelopathy, cervical spinal fusion. No relief with tylenol at home.   HPI     Home Medications Prior to Admission medications   Medication Sig Start Date End Date Taking? Authorizing Provider  amoxicillin-clavulanate (AUGMENTIN) 875-125 MG tablet Take 1 tablet by mouth every 12 (twelve) hours for 10 days. 06/07/22 06/17/22 Yes Gelila Well, Eugene Gavia, PA-C  Acetaminophen (ACETAMINOPHEN EXTRA STRENGTH) 500 MG capsule Take 2 capsules (1,000 mg total) by mouth every 6 (six) hours as needed. 01/05/22     acetaminophen (TYLENOL) 500 MG tablet Take 1,000 mg by mouth every 6 (six) hours as needed for mild pain.    [provider]  atorvastatin (LIPITOR) 20 MG tablet Take 1 tablet (20 mg total) by mouth daily. 03/16/22     Bismuth Subsalicylate (PEPTO-BISMOL) 262 MG TABS Take 1 tablet (262 mg total) by mouth in the morning, at noon, in the evening, and at bedtime for 14 days. 12/09/21     cetirizine (ZYRTEC) 10 MG tablet Take 1 tablet (10 mg total) by mouth daily. 02/06/22     gabapentin (NEURONTIN) 300 MG capsule Take 1 capsule (300 mg total) by mouth at bedtime. 02/06/22     gabapentin (NEURONTIN) 300 MG capsule Take 1 capsule (300 mg total) by mouth at bedtime. 05/09/22     hyoscyamine (LEVBID) 0.375 MG 12 hr tablet Take 1 tablet (0.375 mg total) by mouth every 12 (twelve) hours as needed for cramping 05/17/22     ibuprofen (ADVIL) 200 MG tablet Take 400 mg by  mouth every 6 (six) hours as needed for mild pain.    [provider]  lisinopril (ZESTRIL) 5 MG tablet Take 1 tablet (5 mg total) by mouth daily. 02/06/22     metFORMIN (GLUCOPHAGE) 1000 MG tablet Take 1 tablet (1,000 mg total) by mouth 2 (two) times daily with morning and evening meal. 12/09/21     methocarbamol (ROBAXIN) 500 MG tablet Take 1 tablet (500 mg total) by mouth every 6 (six) hours as needed for muscle spasms. 10/08/21   Tia Alert, MD  methocarbamol (ROBAXIN) 500 MG tablet Take 2 tablets (1,000 mg total) by mouth 4 (four) times daily. 02/06/22     methocarbamol (ROBAXIN) 500 MG tablet Take 2 tablets (1,000 mg total) by mouth every 6 (six) hours as needed for muscle pain. 03/16/22     mirtazapine (REMERON) 7.5 MG tablet Take 1 tablet (7.5 mg total) by mouth at bedtime. 01/05/22     nicotine (NICODERM CQ - DOSED IN MG/24 HOURS) 14 mg/24hr patch Place 1 patch (14 mg total) onto the skin daily (every 24 hours) for 42 days. Stop smoking/vaping on the first day of using this medication. Then use 7mg  patch daily for 2 weeks 03/16/22     nicotine (NICODERM CQ - DOSED IN MG/24 HR) 7 mg/24hr patch Place 1 patch (7 mg total) onto the skin daily (every 24 hours) for 14 days.  03/17/22     omeprazole (PRILOSEC) 20 MG capsule Take 1 capsule (20 mg total) by mouth daily 30 minutes to 1 hour before a meal 02/14/22     oxyCODONE (OXY IR/ROXICODONE) 5 MG immediate release tablet Take 1 tablet (5 mg total) by mouth every 4 (four) hours as needed for moderate pain ((score 4 to 6)). 10/08/21   Eustace Moore, MD  pantoprazole (PROTONIX) 40 MG tablet Take 1 tablet (40 mg total) by mouth daily. Take in place of omeprazole. 05/09/22         Allergies    Patient has no known allergies.    Review of Systems   Review of Systems  Constitutional: Negative.   HENT:  Positive for dental problem. Negative for trouble swallowing and voice change.   Respiratory: Negative.    Gastrointestinal: Negative.    Genitourinary: Negative.   Musculoskeletal: Negative.   Neurological: Negative.     Physical Exam Updated Vital Signs BP (!) 164/90 (BP Location: Right Arm)   Pulse 72   Temp (!) 97.5 F (36.4 C) (Oral)   Resp 20   Wt 54 kg   SpO2 99%   BMI 24.00 kg/m  Physical Exam Vitals and nursing note reviewed.  HENT:     Head: Normocephalic and atraumatic.     Mouth/Throat:     Dentition: Abnormal dentition. Dental tenderness, gingival swelling and dental caries present.   Eyes:     General: No scleral icterus.       Right eye: No discharge.        Left eye: No discharge.     Conjunctiva/sclera: Conjunctivae normal.  Cardiovascular:     Heart sounds: Normal heart sounds. No murmur heard. Pulmonary:     Effort: Pulmonary effort is normal.  Skin:    General: Skin is warm and dry.     Capillary Refill: Capillary refill takes less than 2 seconds.  Neurological:     General: No focal deficit present.     Mental Status: He is alert.  Psychiatric:        Mood and Affect: Mood normal.     ED Results / Procedures / Treatments   Labs (all labs ordered are listed, but only abnormal results are displayed) Labs Reviewed - No data to display  EKG None  Radiology No results found.  Procedures Procedures    Medications Ordered in ED Medications  oxyCODONE-acetaminophen (PERCOCET/ROXICET) 5-325 MG per tablet 1 tablet (1 tablet Oral Given 06/07/22 0536)  amoxicillin-clavulanate (AUGMENTIN) 875-125 MG per tablet 1 tablet (1 tablet Oral Given 06/07/22 0536)    ED Course/ Medical Decision Making/ A&P                           Medical Decision Making 56 year old male who presents with concern for dental pain x 2 days in the right upper jaw.   HTN on intake, VS otherwise normal. Cardiopulmonary exam is normal, dental exam as above consistent with periapical infection. NO sign of oropharyngeal abscess. No sublingual or submental TTP.   Analgesia and first dose of antibiotic  administered in the ED, no further workup is warranted in the ED at this time. Clinical concern for more emergent underlying etiology that would warrant further ED workup or inpatient management is exceedingly low.   Risk Prescription drug management.   Knute  voiced understanding of his medical evaluation and treatment plan. Each of their questions answered to their expressed  satisfaction.  Return precautions were given.  Patient is well-appearing, stable, and was discharged in good condition.  This chart was dictated using voice recognition software, Dragon. Despite the best efforts of this provider to proofread and correct errors, errors may still occur which can change documentation meaning.  Final Clinical Impression(s) / ED Diagnoses Final diagnoses:  Dental infection    Rx / DC Orders ED Discharge Orders          Ordered    amoxicillin-clavulanate (AUGMENTIN) 875-125 MG tablet  Every 12 hours        06/07/22 0527              Temica Righetti, Eugene Gavia, PA-C 06/07/22 0538    Palumbo, April, MD 06/07/22 [phone]

## 2022-08-02 ENCOUNTER — Emergency Department (HOSPITAL_COMMUNITY)
Admission: EM | Admit: 2022-08-02 | Discharge: 2022-08-03 | Disposition: A | Discharge status: Home / Self Care | Payer: Medicaid Other | Attending: Emergency Medicine | Admitting: Emergency Medicine

## 2022-08-02 ENCOUNTER — Encounter (HOSPITAL_COMMUNITY): Payer: Self-pay

## 2022-08-02 ENCOUNTER — Other Ambulatory Visit: Payer: Self-pay

## 2022-08-02 ENCOUNTER — Emergency Department (HOSPITAL_COMMUNITY): Payer: Medicaid Other

## 2022-08-02 DIAGNOSIS — K029 Dental caries, unspecified: Secondary | ICD-10-CM | POA: Diagnosis not present

## 2022-08-02 DIAGNOSIS — Z79899 Other long term (current) drug therapy: Secondary | ICD-10-CM | POA: Insufficient documentation

## 2022-08-02 DIAGNOSIS — Z7984 Long term (current) use of oral hypoglycemic drugs: Secondary | ICD-10-CM | POA: Insufficient documentation

## 2022-08-02 DIAGNOSIS — K047 Periapical abscess without sinus: Secondary | ICD-10-CM | POA: Diagnosis not present

## 2022-08-02 DIAGNOSIS — R220 Localized swelling, mass and lump, head: Secondary | ICD-10-CM | POA: Diagnosis present

## 2022-08-02 LAB — CBC WITH DIFFERENTIAL/PLATELET
Abs Immature Granulocytes: 0.02 10*3/uL (ref 0.00–0.07)
Basophils Absolute: 0.1 10*3/uL (ref 0.0–0.1)
Basophils Relative: 1 %
Eosinophils Absolute: 0 10*3/uL (ref 0.0–0.5)
Eosinophils Relative: 0 %
HCT: 45.3 % (ref 39.0–52.0)
Hemoglobin: 15.7 g/dL (ref 13.0–17.0)
Immature Granulocytes: 0 %
Lymphocytes Relative: 22 %
Lymphs Abs: 2.1 10*3/uL (ref 0.7–4.0)
MCH: 30.6 pg (ref 26.0–34.0)
MCHC: 34.7 g/dL (ref 30.0–36.0)
MCV: 88.3 fL (ref 80.0–100.0)
Monocytes Absolute: 1 10*3/uL (ref 0.1–1.0)
Monocytes Relative: 11 %
Neutro Abs: 6.2 10*3/uL (ref 1.7–7.7)
Neutrophils Relative %: 66 %
Platelets: 227 10*3/uL (ref 150–400)
RBC: 5.13 MIL/uL (ref 4.22–5.81)
RDW: 14.4 % (ref 11.5–15.5)
WBC: 9.5 10*3/uL (ref 4.0–10.5)
nRBC: 0 % (ref 0.0–0.2)

## 2022-08-02 LAB — BASIC METABOLIC PANEL
Anion gap: 16 — ABNORMAL HIGH (ref 5–15)
BUN: 6 mg/dL (ref 6–20)
CO2: 17 mmol/L — ABNORMAL LOW (ref 22–32)
Calcium: 8.6 mg/dL — ABNORMAL LOW (ref 8.9–10.3)
Chloride: 105 mmol/L (ref 98–111)
Creatinine, Ser: 0.75 mg/dL (ref 0.61–1.24)
GFR, Estimated: >60 mL/min (ref >60)
Glucose, Bld: 106 mg/dL — ABNORMAL HIGH (ref 70–99)
Potassium: 4.3 mmol/L (ref 3.5–5.1)
Sodium: 138 mmol/L (ref 135–145)

## 2022-08-02 MED ORDER — FENTANYL CITRATE PF 50 MCG/ML IJ SOSY
PREFILLED_SYRINGE | INTRAMUSCULAR | Status: AC
  Administered 2022-08-02: 50 ug via INTRAVENOUS
  Filled 2022-08-02: qty 1

## 2022-08-02 MED ORDER — FENTANYL CITRATE PF 50 MCG/ML IJ SOSY: 50.0000 ug | PREFILLED_SYRINGE | Freq: Once | INTRAMUSCULAR | Status: AC

## 2022-08-02 MED ORDER — IOHEXOL 350 MG/ML SOLN
75.0000 mL | Freq: Once | INTRAVENOUS | Status: AC | PRN
  Administered 2022-08-02: 75 mL via INTRAVENOUS

## 2022-08-02 NOTE — ED Provider Triage Note (Signed)
Emergency Medicine Provider Triage Evaluation Note  Avry Roedl , a 56 y.o. male  was evaluated in triage.  Pt complains of facial swelling for several days.  History of dental caries.  Was seen about 2 months ago in the ER for same was given antibiotics.  Patient reports symptoms never resolved.  Did not follow-up with a dentist.  Patient reports worsening swelling to the right lower jaw.  Review of Systems  Positive: Facial swelling Negative: Fever  Physical Exam  There were no vitals taken for this visit. Gen:   Awake, no distress   Resp:  Normal effort  MSK:   Moves extremities without difficulty  Other:  Patient with right-sided facial swelling.  Does have 1 finger trismus.  No sublingual or submandibular swelling.  Able to visualize the posterior oropharynx without any evidence of peritonsillar abscess.  No trismus noted.  Medical Decision Making  Medically screening exam initiated at 2:47 PM.  Appropriate orders placed.  Kamaree Wheatley was informed that the remainder of the evaluation will be completed by another provider, this initial triage assessment does not replace that evaluation, and the importance of remaining in the ED until their evaluation is complete.  With ongoing dental infection with facial swelling and trismus noted.  Labs and imaging pending at this time.   Doristine Devoid, PA-C 08/02/22 1448

## 2022-08-02 NOTE — ED Triage Notes (Signed)
Right side facial swelling from a dental abscess that started Friday.  No trouble swallowing. Complains of headache.

## 2022-08-02 NOTE — ED Notes (Signed)
Used interpreter to update patient about long wait and what to expect in the up coming time>  CT scan and then wait for results to be give by the DR and plan of care.

## 2022-08-03 MED ORDER — ONDANSETRON HCL 4 MG/2ML IJ SOLN
4.0000 mg | Freq: Once | INTRAMUSCULAR | Status: AC
  Administered 2022-08-03: 4 mg via INTRAVENOUS
  Filled 2022-08-03: qty 2

## 2022-08-03 MED ORDER — AMOXICILLIN-POT CLAVULANATE 875-125 MG PO TABS: 1.0000 | ORAL_TABLET | Freq: Two times a day (BID) | ORAL | 0 refills | Status: AC

## 2022-08-03 MED ORDER — MORPHINE SULFATE (PF) 4 MG/ML IV SOLN
4.0000 mg | Freq: Once | INTRAVENOUS | Status: AC
  Administered 2022-08-03: 4 mg via INTRAVENOUS
  Filled 2022-08-03: qty 1

## 2022-08-03 MED ORDER — KETOROLAC TROMETHAMINE 15 MG/ML IJ SOLN
30.0000 mg | Freq: Once | INTRAMUSCULAR | Status: AC
  Administered 2022-08-03: 30 mg via INTRAVENOUS
  Filled 2022-08-03: qty 2

## 2022-08-03 MED ORDER — SODIUM CHLORIDE 0.9 % IV SOLN
3.0000 g | Freq: Once | INTRAVENOUS | Status: AC
  Administered 2022-08-03: 3 g via INTRAVENOUS
  Filled 2022-08-03: qty 8

## 2022-08-03 NOTE — ED Notes (Signed)
Discharge instructions reviewed with patients via interpreter. Pt understands need for follow up. Pt denies any further questions and concerns. Pt ambulatory out to lobby.

## 2022-08-03 NOTE — ED Provider Notes (Addendum)
[facility] EMERGENCY DEPARTMENT Provider Note   CSN: [id number] Arrival date & time: 08/02/22  1347     History  Chief Complaint  Patient presents with   Facial Swelling    Marcus James is a 56 y.o. male.  With PMH as listed below who presents with right-sided facial swelling that has been ongoing for approximately 1 week.  Patient was seen in August for similar and completed a course of Augmentin.  He had recently moved from [place] and came in for dental pain and swelling.  He has no dentist but does have insurance.  He took the course of Augmentin and got better.  However over the past week he has had significantly increasing pain and swelling in his right lower mouth.  It is his right bottom molars that bother him the most.  He has been swallowing without difficulty but says it is hard for him to open his mouth.  He has been having significant pain in that area.  He has had no fevers, nausea, chills, vomiting, drooling.  HPI     Home Medications Prior to Admission medications   Medication Sig Start Date End Date Taking? Authorizing Provider  amoxicillin-clavulanate (AUGMENTIN) 875-125 MG tablet Take 1 tablet by mouth every 12 (twelve) hours for 10 days. 08/03/22 08/13/22 Yes Mardene Sayer, MD  Acetaminophen (ACETAMINOPHEN EXTRA STRENGTH) 500 MG capsule Take 2 capsules (1,000 mg total) by mouth every 6 (six) hours as needed. 01/05/22     acetaminophen (TYLENOL) 500 MG tablet Take 1,000 mg by mouth every 6 (six) hours as needed for mild pain.    [provider]  atorvastatin (LIPITOR) 20 MG tablet Take 1 tablet (20 mg total) by mouth daily. 03/16/22     Bismuth Subsalicylate (PEPTO-BISMOL) 262 MG TABS Take 1 tablet (262 mg total) by mouth in the morning, at noon, in the evening, and at bedtime for 14 days. 12/09/21     cetirizine (ZYRTEC) 10 MG tablet Take 1 tablet (10 mg total) by mouth daily. 02/06/22     gabapentin (NEURONTIN) 300 MG capsule Take 1  capsule (300 mg total) by mouth at bedtime. 02/06/22     gabapentin (NEURONTIN) 300 MG capsule Take 1 capsule (300 mg total) by mouth at bedtime. 05/09/22     hyoscyamine (LEVBID) 0.375 MG 12 hr tablet Take 1 tablet (0.375 mg total) by mouth every 12 (twelve) hours as needed for cramping 05/17/22     ibuprofen (ADVIL) 200 MG tablet Take 400 mg by mouth every 6 (six) hours as needed for mild pain.    [provider]  lisinopril (ZESTRIL) 5 MG tablet Take 1 tablet (5 mg total) by mouth daily. 02/06/22     metFORMIN (GLUCOPHAGE) 1000 MG tablet Take 1 tablet (1,000 mg total) by mouth 2 (two) times daily with morning and evening meal. 12/09/21     methocarbamol (ROBAXIN) 500 MG tablet Take 1 tablet (500 mg total) by mouth every 6 (six) hours as needed for muscle spasms. 10/08/21   Tia Alert, MD  methocarbamol (ROBAXIN) 500 MG tablet Take 2 tablets (1,000 mg total) by mouth 4 (four) times daily. 02/06/22     methocarbamol (ROBAXIN) 500 MG tablet Take 2 tablets (1,000 mg total) by mouth every 6 (six) hours as needed for muscle pain. 03/16/22     mirtazapine (REMERON) 7.5 MG tablet Take 1 tablet (7.5 mg total) by mouth at bedtime. 01/05/22     nicotine (NICODERM CQ - DOSED IN  MG/24 HR) 7 mg/24hr patch Place 1 patch (7 mg total) onto the skin daily (every 24 hours) for 14 days. 03/17/22     omeprazole (PRILOSEC) 20 MG capsule Take 1 capsule (20 mg total) by mouth daily 30 minutes to 1 hour before a meal 02/14/22     oxyCODONE (OXY IR/ROXICODONE) 5 MG immediate release tablet Take 1 tablet (5 mg total) by mouth every 4 (four) hours as needed for moderate pain ((score 4 to 6)). 10/08/21   Tia Alert, MD  pantoprazole (PROTONIX) 40 MG tablet Take 1 tablet (40 mg total) by mouth daily. Take in place of omeprazole. 05/09/22         Allergies    Patient has no known allergies.    Review of Systems   Review of Systems  Physical Exam Updated Vital Signs BP (!) 166/90   Pulse 81   Temp 98.8 F (37.1  C)   Resp 16   Ht 4\' 11"  (1.499 m)   Wt 54 kg   SpO2 97%   BMI 24.04 kg/m  Physical Exam Constitutional: Alert and oriented.  Uncomfortable but nontoxic Eyes: Conjunctivae are normal. ENT      Head: Normocephalic and atraumatic.      Nose: No congestion.      Mouth/Throat: Significant right mandibular and cheek swelling and tenderness to the touch.  No active drainage.  Mucous membranes are moist.  Multiple dental caries.  Point tenderness over the right mandibular posterior molars.  No dentin or root exposure.  No periapical abscess.  Multiple dental caries.  No tongue raising.  No drooling. Mild 2.5 finger trismus      Neck: No stridor.  No submandibular or submental swelling, no swelling or brawniness of the neck Cardiovascular: S1, S2, regular rate Respiratory: Normal respiratory effort.  O2 sat 100% on room air Gastrointestinal: Soft  Musculoskeletal: Normal range of motion in all extremities. Neurologic: Normal speech and language. No gross focal neurologic deficits are appreciated. Skin: Skin is warm, dry and intact. No rash noted. Psychiatric: Mood and affect are normal. Speech and behavior are normal.  ED Results / Procedures / Treatments   Labs (all labs ordered are listed, but only abnormal results are displayed) Labs Reviewed  BASIC METABOLIC PANEL - Abnormal; Notable for the following components:      Result Value   CO2 17 (*)    Glucose, Bld 106 (*)    Calcium 8.6 (*)    Anion gap 16 (*)    All other components within normal limits  CBC WITH DIFFERENTIAL/PLATELET    EKG None  Radiology CT Maxillofacial W Contrast  Result Date: 08/02/2022 CLINICAL DATA:  Maxillary size facial abscess. Dental infection. Right-sided facial swelling with trismus. EXAM: CT MAXILLOFACIAL WITH CONTRAST TECHNIQUE: Multidetector CT imaging of the maxillofacial structures was performed with intravenous contrast. Multiplanar CT image reconstructions were also generated. RADIATION DOSE  REDUCTION: This exam was performed according to the departmental dose-optimization program which includes automated exposure control, adjustment of the mA and/or kV according to patient size and/or use of iterative reconstruction technique. CONTRAST:  2mL OMNIPAQUE IOHEXOL 350 MG/ML SOLN COMPARISON:  Head CT and MRI 10/05/2021. Maxillofacial CT 01/28/2017. FINDINGS: Osseous: No acute fracture. Remote, healed left-sided tripod fracture. Multiple dental caries predominantly affecting maxillary and mandibular molar teeth bilaterally. Periapical lucency involving the right mandibular second molar. Focal disruption of the lateral cortex of the posterior mandibular body on the right with overlying 10 x 4 mm fluid collection (series  3, image 56). Orbits: Unremarkable. Sinuses: Paranasal sinuses and mastoid air cells are clear. Soft tissues: Prominent thickening of the soft tissues overlying the mandibular body on the right. Limited intracranial: Mild carotid siphon atherosclerosis. IMPRESSION: Poor dentition with periapical erosion associated with the right mandibular. Overlying soft tissue swelling/cellulitis and likely 10 x 4 mm subperiosteal abscess. Electronically Signed   By: Logan Bores M.D.   On: 08/02/2022 19:04    Procedures Procedures    Medications Ordered in ED Medications  fentaNYL (SUBLIMAZE) injection 50 mcg (50 mcg Intravenous Given 08/02/22 1503)  iohexol (OMNIPAQUE) 350 MG/ML injection 75 mL (75 mLs Intravenous Contrast Given 08/02/22 1834)  Ampicillin-Sulbactam (UNASYN) 3 g in sodium chloride 0.9 % 100 mL IVPB (0 g Intravenous Stopped 08/03/22 0923)  ketorolac (TORADOL) 15 MG/ML injection 30 mg (30 mg Intravenous Given 08/03/22 0847)  morphine (PF) 4 MG/ML injection 4 mg (4 mg Intravenous Given 08/03/22 0849)  ondansetron (ZOFRAN) injection 4 mg (4 mg Intravenous Given 08/03/22 0846)    ED Course/ Medical Decision Making/ A&P                           Medical Decision Making Avram Danielson is a 56 y.o. male.  With PMH as listed below who presents with right-sided facial swelling that has been ongoing for approximately 1 week.   Patient with history of dental caries presenting with right atraumatic mandibular facial swelling and tenderness.  He is not drooling has no stridor and nontoxic appearance, controlling his secretions, not concern for Ludwig's angina.  Not concern for sepsis with normal blood pressure, no tachycardia, no fever, no tachypnea and normal white blood cell count 9.5.  His presentation was most concerning for possible dental abscess or submandibular abscess.  A CT scan was obtained of the face which I personally reviewed with contrast that shows right-sided mandibular periapical erosions and caries with associated cellulitis and 10 mm x 4 mm for subperiosteal abscess.   Ordered for IV Unasyn, Morphine, Zofran and Toradol.  Consulted dentistry Dr. Haig Prophet who can see patient today in clinic.  I have discharged patient with information for clinic to be seen at 130 today in 10 days of continued Augmentin.  Strict return precaution discussed.  Risk Prescription drug management.    Final Clinical Impression(s) / ED Diagnoses Final diagnoses:  Dental abscess    Rx / DC Orders ED Discharge Orders          Ordered    amoxicillin-clavulanate (AUGMENTIN) 875-125 MG tablet  Every 12 hours        08/03/22 0927              Elgie Congo, MD 08/03/22 7353    Elgie Congo, MD 08/03/22 Banks, [place], MD 08/03/22 [phone]

## 2022-08-03 NOTE — Discharge Instructions (Addendum)
GO TO: [facility] [address], [place] [address] at 1:30 PM TODAY  See Dr Haig Prophet (dentistry)  Bring dental insurance cards if you have them.   You have a dental abscess so you need to see the dentist listed above.   Keep taking the antibiotics as prescribed.  You can take Tylenol and ibuprofen for pain control.  Come back if any further issues, worsening swelling, inability to breathe, drooling, or any other symptoms concerning to you.

## 2022-08-18 ENCOUNTER — Other Ambulatory Visit (HOSPITAL_COMMUNITY): Payer: Self-pay

## 2022-10-04 ENCOUNTER — Other Ambulatory Visit (HOSPITAL_COMMUNITY): Payer: Self-pay

## 2022-10-05 ENCOUNTER — Other Ambulatory Visit (HOSPITAL_COMMUNITY): Payer: Self-pay

## 2022-10-09 ENCOUNTER — Other Ambulatory Visit (HOSPITAL_COMMUNITY): Payer: Self-pay

## 2022-10-12 ENCOUNTER — Other Ambulatory Visit (HOSPITAL_COMMUNITY): Payer: Self-pay

## 2022-10-30 ENCOUNTER — Other Ambulatory Visit (HOSPITAL_COMMUNITY): Payer: Self-pay

## 2022-11-16 ENCOUNTER — Other Ambulatory Visit (HOSPITAL_COMMUNITY): Payer: Self-pay

## 2022-11-17 ENCOUNTER — Other Ambulatory Visit (HOSPITAL_COMMUNITY): Payer: Self-pay

## 2022-12-12 ENCOUNTER — Other Ambulatory Visit (HOSPITAL_COMMUNITY): Payer: Self-pay

## 2022-12-20 ENCOUNTER — Ambulatory Visit (HOSPITAL_BASED_OUTPATIENT_CLINIC_OR_DEPARTMENT_OTHER): Payer: Medicaid Other | Admitting: Orthopaedic Surgery

## 2022-12-25 ENCOUNTER — Other Ambulatory Visit (HOSPITAL_COMMUNITY): Payer: Self-pay

## 2022-12-25 MED ORDER — ACETAMINOPHEN EXTRA STRENGTH 500 MG PO CAPS
1000.0000 mg | ORAL_CAPSULE | Freq: Four times a day (QID) | ORAL | 0 refills | Status: DC | PRN
  Filled 2022-12-25: qty 240, 30d supply, fill #0

## 2022-12-25 MED ORDER — GABAPENTIN 300 MG PO CAPS
300.0000 mg | ORAL_CAPSULE | Freq: Three times a day (TID) | ORAL | 0 refills | Status: DC
  Filled 2022-12-25 – 2023-01-16 (×2): qty 90, 30d supply, fill #0

## 2023-01-08 ENCOUNTER — Other Ambulatory Visit (HOSPITAL_COMMUNITY): Payer: Self-pay

## 2023-01-08 MED ORDER — PANTOPRAZOLE SODIUM 40 MG PO TBEC
40.0000 mg | DELAYED_RELEASE_TABLET | Freq: Every day | ORAL | 3 refills | Status: DC
  Filled 2023-01-08: qty 30, 30d supply, fill #0
  Filled 2023-01-29 – 2023-03-20 (×3): qty 30, 30d supply, fill #1

## 2023-01-09 ENCOUNTER — Other Ambulatory Visit (HOSPITAL_COMMUNITY): Payer: Self-pay

## 2023-01-10 ENCOUNTER — Other Ambulatory Visit (HOSPITAL_COMMUNITY): Payer: Self-pay

## 2023-01-10 MED ORDER — LISINOPRIL 10 MG PO TABS
10.0000 mg | ORAL_TABLET | Freq: Every day | ORAL | 0 refills | Status: DC
  Filled 2023-01-10: qty 90, 90d supply, fill #0

## 2023-01-16 ENCOUNTER — Other Ambulatory Visit (HOSPITAL_COMMUNITY): Payer: Self-pay

## 2023-01-29 ENCOUNTER — Other Ambulatory Visit (HOSPITAL_COMMUNITY): Payer: Self-pay

## 2023-01-29 MED ORDER — PANTOPRAZOLE SODIUM 40 MG PO TBEC
40.0000 mg | DELAYED_RELEASE_TABLET | Freq: Every day | ORAL | 0 refills | Status: DC
  Filled 2023-01-29: qty 30, 30d supply, fill #0
  Filled 2023-05-01: qty 30, 30d supply, fill #1
  Filled 2023-06-11: qty 30, 30d supply, fill #2

## 2023-01-30 ENCOUNTER — Other Ambulatory Visit (HOSPITAL_COMMUNITY): Payer: Self-pay

## 2023-02-08 ENCOUNTER — Other Ambulatory Visit (HOSPITAL_COMMUNITY): Payer: Self-pay

## 2023-02-08 MED ORDER — HYOSCYAMINE SULFATE 0.125 MG PO TBDP
0.1250 mg | ORAL_TABLET | Freq: Two times a day (BID) | ORAL | 3 refills | Status: DC | PRN
  Filled 2023-02-08 – 2023-02-09 (×3): qty 90, 45d supply, fill #0
  Filled 2023-03-20: qty 10, 5d supply, fill #1
  Filled 2023-03-21: qty 80, 40d supply, fill #1
  Filled 2023-07-11: qty 90, 45d supply, fill #2

## 2023-02-09 ENCOUNTER — Other Ambulatory Visit (HOSPITAL_COMMUNITY): Payer: Self-pay

## 2023-02-09 ENCOUNTER — Other Ambulatory Visit: Payer: Self-pay | Admitting: Family Medicine

## 2023-02-09 DIAGNOSIS — R109 Unspecified abdominal pain: Secondary | ICD-10-CM

## 2023-02-12 ENCOUNTER — Other Ambulatory Visit (HOSPITAL_COMMUNITY): Payer: Self-pay

## 2023-03-20 ENCOUNTER — Other Ambulatory Visit: Payer: Self-pay

## 2023-03-20 ENCOUNTER — Other Ambulatory Visit (HOSPITAL_COMMUNITY): Payer: Self-pay

## 2023-03-20 MED ORDER — ATORVASTATIN CALCIUM 20 MG PO TABS
20.0000 mg | ORAL_TABLET | Freq: Every evening | ORAL | 0 refills | Status: DC
  Filled 2023-03-20: qty 90, 90d supply, fill #0

## 2023-03-20 MED ORDER — LISINOPRIL 10 MG PO TABS
10.0000 mg | ORAL_TABLET | Freq: Every day | ORAL | 0 refills | Status: DC
  Filled 2023-03-20 (×2): qty 90, 90d supply, fill #0

## 2023-03-21 ENCOUNTER — Other Ambulatory Visit (HOSPITAL_COMMUNITY): Payer: Self-pay

## 2023-03-21 ENCOUNTER — Other Ambulatory Visit: Payer: Self-pay

## 2023-03-22 ENCOUNTER — Other Ambulatory Visit (HOSPITAL_COMMUNITY): Payer: Self-pay

## 2023-03-26 ENCOUNTER — Other Ambulatory Visit (HOSPITAL_COMMUNITY): Payer: Self-pay

## 2023-03-27 ENCOUNTER — Other Ambulatory Visit (HOSPITAL_COMMUNITY): Payer: Self-pay

## 2023-03-28 ENCOUNTER — Other Ambulatory Visit (HOSPITAL_COMMUNITY): Payer: Self-pay

## 2023-03-28 MED ORDER — GABAPENTIN 300 MG PO CAPS
300.0000 mg | ORAL_CAPSULE | Freq: Three times a day (TID) | ORAL | 3 refills | Status: AC
  Filled 2023-03-28: qty 270, 90d supply, fill #0

## 2023-05-01 ENCOUNTER — Other Ambulatory Visit (HOSPITAL_COMMUNITY): Payer: Self-pay

## 2023-05-02 ENCOUNTER — Other Ambulatory Visit (HOSPITAL_COMMUNITY): Payer: Self-pay

## 2023-05-03 ENCOUNTER — Other Ambulatory Visit (HOSPITAL_COMMUNITY): Payer: Self-pay

## 2023-05-03 MED ORDER — METHOCARBAMOL 500 MG PO TABS
1000.0000 mg | ORAL_TABLET | Freq: Four times a day (QID) | ORAL | 3 refills | Status: DC | PRN
  Filled 2023-05-03: qty 180, 30d supply, fill #0
  Filled 2023-07-11: qty 180, 23d supply, fill #1

## 2023-05-10 ENCOUNTER — Other Ambulatory Visit (HOSPITAL_COMMUNITY): Payer: Self-pay

## 2023-05-10 MED ORDER — TIZANIDINE HCL 4 MG PO TABS
4.0000 mg | ORAL_TABLET | Freq: Three times a day (TID) | ORAL | 0 refills | Status: DC | PRN
  Filled 2023-05-10: qty 90, 30d supply, fill #0

## 2023-05-16 ENCOUNTER — Telehealth: Payer: Self-pay

## 2023-05-16 ENCOUNTER — Ambulatory Visit: Payer: Medicaid Other | Admitting: Physician Assistant

## 2023-05-16 VITALS — BP 145/86 | HR 67 | Temp 98.8°F | Ht 59.0 in | Wt 110.0 lb

## 2023-05-16 DIAGNOSIS — Z981 Arthrodesis status: Secondary | ICD-10-CM

## 2023-05-16 DIAGNOSIS — Z603 Acculturation difficulty: Secondary | ICD-10-CM | POA: Diagnosis not present

## 2023-05-16 DIAGNOSIS — Z758 Other problems related to medical facilities and other health care: Secondary | ICD-10-CM

## 2023-05-16 DIAGNOSIS — G8929 Other chronic pain: Secondary | ICD-10-CM

## 2023-05-16 DIAGNOSIS — M545 Low back pain, unspecified: Secondary | ICD-10-CM | POA: Diagnosis not present

## 2023-05-16 DIAGNOSIS — R1084 Generalized abdominal pain: Secondary | ICD-10-CM

## 2023-05-16 MED ORDER — KETOROLAC TROMETHAMINE 60 MG/2ML IM SOLN
60.0000 mg | Freq: Once | INTRAMUSCULAR | Status: AC
  Administered 2023-05-16: 60 mg via INTRAMUSCULAR

## 2023-05-16 MED ORDER — METHYLPREDNISOLONE ACETATE 80 MG/ML IJ SUSP
80.0000 mg | Freq: Once | INTRAMUSCULAR | Status: AC
  Administered 2023-05-16: 80 mg via INTRAMUSCULAR

## 2023-05-16 NOTE — Telephone Encounter (Signed)
Patient presents to the Mobile Medicine Unit with back and abdominal pain. Patient was referred to [facility], by PCP Roselyn Reef, FNP. Patient received injection on 04.18.2024, and was due back in 2 weeks for another injection. Due to language barrier patient was unable to get appointment rescheduled.   [facility] contacted on today's visit, and was scheduled for patient for Friday August 2nd at 2:30 pm. Wonda Cerise [id number] assisted with informing patient with appointment information and Location. All questions answered at time of visit.
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
## 2023-05-17 ENCOUNTER — Encounter: Payer: Self-pay | Admitting: Physician Assistant

## 2023-05-22 ENCOUNTER — Other Ambulatory Visit: Payer: Self-pay

## 2023-05-22 ENCOUNTER — Encounter (HOSPITAL_COMMUNITY): Payer: Self-pay | Admitting: Emergency Medicine

## 2023-05-22 ENCOUNTER — Emergency Department (HOSPITAL_COMMUNITY)
Admission: EM | Admit: 2023-05-22 | Discharge: 2023-05-22 | Disposition: A | Discharge status: Home / Self Care | Payer: Medicaid Other | Attending: Emergency Medicine | Admitting: Emergency Medicine

## 2023-05-22 ENCOUNTER — Emergency Department (HOSPITAL_COMMUNITY): Payer: Medicaid Other

## 2023-05-22 DIAGNOSIS — R748 Abnormal levels of other serum enzymes: Secondary | ICD-10-CM | POA: Insufficient documentation

## 2023-05-22 DIAGNOSIS — E119 Type 2 diabetes mellitus without complications: Secondary | ICD-10-CM | POA: Insufficient documentation

## 2023-05-22 DIAGNOSIS — Z7984 Long term (current) use of oral hypoglycemic drugs: Secondary | ICD-10-CM | POA: Diagnosis not present

## 2023-05-22 DIAGNOSIS — R1030 Lower abdominal pain, unspecified: Secondary | ICD-10-CM | POA: Diagnosis present

## 2023-05-22 DIAGNOSIS — K529 Noninfective gastroenteritis and colitis, unspecified: Secondary | ICD-10-CM | POA: Diagnosis not present

## 2023-05-22 LAB — COMPREHENSIVE METABOLIC PANEL WITH GFR
ALT: 122 U/L — ABNORMAL HIGH (ref 0–44)
AST: 208 U/L — ABNORMAL HIGH (ref 15–41)
Albumin: 4 g/dL (ref 3.5–5.0)
Alkaline Phosphatase: 156 U/L — ABNORMAL HIGH (ref 38–126)
Anion gap: 13 (ref 5–15)
BUN: 9 mg/dL (ref 6–20)
CO2: 24 mmol/L (ref 22–32)
Calcium: 9.1 mg/dL (ref 8.9–10.3)
Chloride: 97 mmol/L — ABNORMAL LOW (ref 98–111)
Creatinine, Ser: 0.62 mg/dL (ref 0.61–1.24)
GFR, Estimated: >60 mL/min
Glucose, Bld: 114 mg/dL — ABNORMAL HIGH (ref 70–99)
Potassium: 4 mmol/L (ref 3.5–5.1)
Sodium: 134 mmol/L — ABNORMAL LOW (ref 135–145)
Total Bilirubin: 1.5 mg/dL — ABNORMAL HIGH (ref 0.3–1.2)
Total Protein: 7.8 g/dL (ref 6.5–8.1)

## 2023-05-22 LAB — CBC
HCT: 43.7 % (ref 39.0–52.0)
Hemoglobin: 15 g/dL (ref 13.0–17.0)
MCH: 29 pg (ref 26.0–34.0)
MCHC: 34.3 g/dL (ref 30.0–36.0)
MCV: 84.4 fL (ref 80.0–100.0)
Platelets: 210 10*3/uL (ref 150–400)
RBC: 5.18 MIL/uL (ref 4.22–5.81)
RDW: 15.6 % — ABNORMAL HIGH (ref 11.5–15.5)
WBC: 5.2 10*3/uL (ref 4.0–10.5)
nRBC: 0 % (ref 0.0–0.2)

## 2023-05-22 LAB — LIPASE, BLOOD: Lipase: 45 U/L (ref 11–51)

## 2023-05-22 MED ORDER — FENTANYL CITRATE PF 50 MCG/ML IJ SOSY
50.0000 ug | PREFILLED_SYRINGE | Freq: Once | INTRAMUSCULAR | Status: AC
  Administered 2023-05-22: 50 ug via INTRAVENOUS
  Filled 2023-05-22: qty 1

## 2023-05-22 MED ORDER — AMOXICILLIN-POT CLAVULANATE 875-125 MG PO TABS: 1.0000 | ORAL_TABLET | Freq: Two times a day (BID) | ORAL | 0 refills | Status: DC

## 2023-05-22 MED ORDER — AMOXICILLIN-POT CLAVULANATE 875-125 MG PO TABS
1.0000 | ORAL_TABLET | Freq: Once | ORAL | Status: AC
  Administered 2023-05-22: 1 via ORAL
  Filled 2023-05-22: qty 1

## 2023-05-22 MED ORDER — IOHEXOL 350 MG/ML SOLN
75.0000 mL | Freq: Once | INTRAVENOUS | Status: AC | PRN
  Administered 2023-05-22: 75 mL via INTRAVENOUS

## 2023-05-22 MED ORDER — ONDANSETRON HCL 4 MG/2ML IJ SOLN
4.0000 mg | Freq: Once | INTRAMUSCULAR | Status: AC
  Administered 2023-05-22: 4 mg via INTRAVENOUS
  Filled 2023-05-22: qty 2

## 2023-05-22 MED ORDER — SODIUM CHLORIDE 0.9 % IV BOLUS
1000.0000 mL | Freq: Once | INTRAVENOUS | Status: AC
  Administered 2023-05-22: 1000 mL via INTRAVENOUS

## 2023-05-22 NOTE — ED Provider Notes (Signed)
[place] EMERGENCY DEPARTMENT AT [facility] Provider Note   CSN: [id number] Arrival date & time: 05/22/23  1131     History  Chief Complaint  Patient presents with   Abdominal Pain    Marcus James is a 57 y.o. male.  Patient here with lower abdominal pain mostly left-sided.  But also having left flank and lower back pain.  He is having pain down his left leg at times.  He denies any numbness weakness or difficulty with bowel movements.  Denies any chest pain or shortness of breath.  He has been having pain for few weeks but worse here the last few days.  Not eating or drinking much.  Denies history of abdominal surgery.  History of diabetes and high blood pressure.  The history is provided by the patient.       Home Medications Prior to Admission medications   Medication Sig Start Date End Date Taking? Authorizing Provider  amoxicillin-clavulanate (AUGMENTIN) 875-125 MG tablet Take 1 tablet by mouth every 12 (twelve) hours. 05/22/23  Yes Kalia Vahey, DO  Acetaminophen (ACETAMINOPHEN EXTRA STRENGTH) 500 MG capsule Take 2 capsules (1,000 mg total) by mouth every 6 (six) hours as needed. Patient not taking: Reported on 05/16/2023 01/05/22     acetaminophen (TYLENOL) 500 MG tablet Take 1,000 mg by mouth every 6 (six) hours as needed for mild pain. Patient not taking: Reported on 05/16/2023    [provider]  Acetaminophen Extra Strength 500 MG CAPS Take 1,000 mg by mouth every 6 (six) hours as needed for pain Patient not taking: Reported on 05/16/2023 12/22/22     atorvastatin (LIPITOR) 20 MG tablet Take 1 tablet (20 mg total) by mouth at bedtime. 03/20/23     Bismuth Subsalicylate (PEPTO-BISMOL) 262 MG TABS Take 1 tablet (262 mg total) by mouth in the morning, at noon, in the evening, and at bedtime for 14 days. Patient not taking: Reported on 05/16/2023 12/09/21     cetirizine (ZYRTEC) 10 MG tablet Take 1 tablet (10 mg total) by mouth daily. Patient not taking:  Reported on 05/16/2023 02/06/22     gabapentin (NEURONTIN) 300 MG capsule Take 1 capsule (300 mg total) by mouth at bedtime. 02/06/22     gabapentin (NEURONTIN) 300 MG capsule Take 1 capsule (300 mg total) by mouth 3 (three) times daily. 03/27/23     hyoscyamine (ANASPAZ) 0.125 MG TBDP disintergrating tablet Dissolve 1 tablet (0.125 mg total) by mouth 2 (two) times daily as needed for pain 02/08/23     hyoscyamine (LEVBID) 0.375 MG 12 hr tablet Take 1 tablet (0.375 mg total) by mouth every 12 (twelve) hours as needed for cramping 05/17/22     ibuprofen (ADVIL) 200 MG tablet Take 400 mg by mouth every 6 (six) hours as needed for mild pain.    [provider]  lisinopril (ZESTRIL) 10 MG tablet Take 1 tablet (10 mg total) by mouth daily. 03/20/23     metFORMIN (GLUCOPHAGE) 1000 MG tablet Take 1 tablet (1,000 mg total) by mouth 2 (two) times daily with morning and evening meal. Patient not taking: Reported on 05/16/2023 12/09/21     methocarbamol (ROBAXIN) 500 MG tablet Take 2 tablets (1,000 mg total) by mouth every 6 (six) hours as needed for muscle pain 05/02/23     mirtazapine (REMERON) 7.5 MG tablet Take 1 tablet (7.5 mg total) by mouth at bedtime. 01/05/22     nicotine (NICODERM CQ - DOSED IN MG/24 HR) 7 mg/24hr patch Place  1 patch (7 mg total) onto the skin daily (every 24 hours) for 14 days. Patient not taking: Reported on 05/16/2023 03/17/22     omeprazole (PRILOSEC) 20 MG capsule Take 1 capsule (20 mg total) by mouth daily 30 minutes to 1 hour before a meal Patient not taking: Reported on 05/16/2023 02/14/22     oxyCODONE (OXY IR/ROXICODONE) 5 MG immediate release tablet Take 1 tablet (5 mg total) by mouth every 4 (four) hours as needed for moderate pain ((score 4 to 6)). Patient not taking: Reported on 05/16/2023 10/08/21   Arman Bogus, MD  pantoprazole (PROTONIX) 40 MG tablet Take 1 tablet (40 mg total) by mouth daily. 01/29/23     tiZANidine (ZANAFLEX) 4 MG tablet Take 1 tablet (4 mg total) by  mouth every 8 (eight) hours as needed for muscle spasms. 05/10/23         Allergies    Grass pollen(k-o-r-t-swt vern)    Review of Systems   Review of Systems  Physical Exam Updated Vital Signs BP (!) 150/68 (BP Location: Left Arm)   Pulse 71   Temp 98.5 F (36.9 C) (Oral)   Resp 16   Ht 4\' 11"  (1.499 m)   Wt 49.9 kg   SpO2 96%   BMI 22.22 kg/m  Physical Exam Vitals and nursing note reviewed.  Constitutional:      General: He is not in acute distress.    Appearance: He is well-developed.  HENT:     Head: Normocephalic and atraumatic.  Eyes:     Extraocular Movements: Extraocular movements intact.     Conjunctiva/sclera: Conjunctivae normal.  Cardiovascular:     Rate and Rhythm: Normal rate and regular rhythm.     Pulses:          Dorsalis pedis pulses are 2+ on the right side and 2+ on the left side.     Heart sounds: Normal heart sounds. No murmur heard. Pulmonary:     Effort: Pulmonary effort is normal. No respiratory distress.     Breath sounds: Normal breath sounds.  Abdominal:     Palpations: Abdomen is soft.     Tenderness: There is abdominal tenderness in the left upper quadrant and left lower quadrant. There is left CVA tenderness.  Musculoskeletal:        General: No swelling.     Cervical back: Neck supple.  Skin:    General: Skin is warm and dry.     Capillary Refill: Capillary refill takes less than 2 seconds.  Neurological:     General: No focal deficit present.     Mental Status: He is alert.     Cranial Nerves: No cranial nerve deficit.     Motor: No weakness.     Comments: 5+ out of 5 strength throughout, normal sensation  Psychiatric:        Mood and Affect: Mood normal.     ED Results / Procedures / Treatments   Labs (all labs ordered are listed, but only abnormal results are displayed) Labs Reviewed  COMPREHENSIVE METABOLIC PANEL - Abnormal; Notable for the following components:      Result Value   Sodium 134 (*)    Chloride 97 (*)     Glucose, Bld 114 (*)    AST 208 (*)    ALT 122 (*)    Alkaline Phosphatase 156 (*)    Total Bilirubin 1.5 (*)    All other components within normal limits  CBC - Abnormal; Notable for  the following components:   RDW 15.6 (*)    All other components within normal limits  LIPASE, BLOOD    EKG None  Radiology CT L-SPINE NO CHARGE  Result Date: 05/22/2023 CLINICAL DATA:  [id number] Pain [id number] EXAM: CT LUMBAR SPINE WITHOUT CONTRAST TECHNIQUE: Multidetector CT imaging of the lumbar spine was performed without intravenous contrast administration. Multiplanar CT image reconstructions were also generated. RADIATION DOSE REDUCTION: This exam was performed according to the departmental dose-optimization program which includes automated exposure control, adjustment of the mA and/or kV according to patient size and/or use of iterative reconstruction technique. COMPARISON:  MRI lumbar spine 12/10/2022. FINDINGS: Segmentation: 5 lumbar type vertebrae. Alignment: Mild retrolisthesis of L3 on L4. Vertebrae: Posterior osteophyte complex formation at the L5-S1 level. Multilevel mild degenerative changes of the spine with severe degenerative changes of the L5-S1 level. No associated severe osseous neural foraminal or central canal stenosis. Pseudoarthrosis at the right L5-S1 level. No acute fracture or focal pathologic process. Paraspinal and other soft tissues: Negative. Disc levels: Intervertebral disc space vacuum phenomenon at the L5-S1 level. Other: Atherosclerotic plaque. IMPRESSION: 1. No acute displaced fracture or traumatic listhesis of the lumbar spine. 2.  Aortic Atherosclerosis (ICD10-I70.0). Electronically Signed   By: Tish Frederickson M.D.   On: 05/22/2023 19:37   CT ABDOMEN PELVIS W CONTRAST  Result Date: 05/22/2023 CLINICAL DATA:  Abdominal pain. EXAM: CT ABDOMEN AND PELVIS WITH CONTRAST TECHNIQUE: Multidetector CT imaging of the abdomen and pelvis was performed using the standard protocol following  bolus administration of intravenous contrast. RADIATION DOSE REDUCTION: This exam was performed according to the departmental dose-optimization program which includes automated exposure control, adjustment of the mA and/or kV according to patient size and/or use of iterative reconstruction technique. CONTRAST:  75mL OMNIPAQUE IOHEXOL 350 MG/ML SOLN COMPARISON:  CT abdomen pelvis dated 04/13/2017. FINDINGS: Evaluation of this exam is limited due to respiratory motion artifact. Lower chest: The visualized lung bases are clear. No intra-abdominal free air or free fluid. Hepatobiliary: The liver is unremarkable. No biliary dilatation. The gallbladder is unremarkable. Pancreas: Unremarkable. No pancreatic ductal dilatation or surrounding inflammatory changes. Spleen: Normal in size without focal abnormality. Adrenals/Urinary Tract: The adrenal glands are unremarkable. There is no hydronephrosis on either side. There is symmetric enhancement and excretion of contrast by both kidneys. The visualized ureters and the urinary bladder appear unremarkable. Stomach/Bowel: Mild diffuse thickened appearance of the colon, likely related to underdistention. Colitis is less likely but not excluded clinical correlation is recommended. There is no bowel obstruction. The appendix is normal. Vascular/Lymphatic: Mild aortoiliac atherosclerotic disease. The IVC is unremarkable. No portal venous gas. There is no adenopathy. Reproductive: The prostate and seminal vesicles are grossly unremarkable. No pelvic mass. Other: None Musculoskeletal: Degenerative changes of the lower lumbar spine. No acute osseous pathology. IMPRESSION: 1. Underdistention of the colon versus less likely mild colitis. Clinical correlation is recommended. No bowel obstruction. Normal appendix. 2.  Aortic Atherosclerosis (ICD10-I70.0). Electronically Signed   By: Elgie Collard M.D.   On: 05/22/2023 19:33    Procedures Procedures    Medications Ordered in  ED Medications  amoxicillin-clavulanate (AUGMENTIN) 875-125 MG per tablet 1 tablet (has no administration in time range)  sodium chloride 0.9 % bolus 1,000 mL (1,000 mLs Intravenous New Bag/Given 05/22/23 1829)  ondansetron (ZOFRAN) injection 4 mg (4 mg Intravenous Given 05/22/23 1832)  fentaNYL (SUBLIMAZE) injection 50 mcg (50 mcg Intravenous Given 05/22/23 1833)  iohexol (OMNIPAQUE) 350 MG/ML injection 75 mL (75 mLs Intravenous Contrast Given 05/22/23  [place])    ED Course/ Medical Decision Making/ A&P                                 Medical Decision Making Amount and/or Complexity of Data Reviewed Labs: ordered. Radiology: ordered.  Risk Prescription drug management.   Marcus James is here with abdominal pain and back pain.  History of diabetes and hypertension.  Unremarkable vitals.  No fever.  Differential diagnosis kidney stone versus UTI versus diverticulitis versus less likely bowel obstruction, he is having some pain in the back and could be muscular process as well.  Will get CBC, CMP, lipase, urinalysis, CT scan abdomen pelvis.  Will give IV fluids and IV Zofran and reevaluate.  Overall per my review and interpretation labs his liver enzymes are mildly elevated but they are at baseline when I look at prior results in outside systems.  He has no significant AKI.  No significant leukocytosis or anemia.  CT imaging consistent with a colitis.  Otherwise there is no bowel obstruction, normal appendix.  Lumbar CT with no acute processes per radiology report.  Overall we will treat with Augmentin.  Will have him follow-up with his primary care doctor.  Discharged in good condition.  This chart was dictated using voice recognition software.  Despite best efforts to proofread,  errors can occur which can change the documentation meaning.         Final Clinical Impression(s) / ED Diagnoses Final diagnoses:  Colitis    Rx / DC Orders ED Discharge Orders          Ordered     amoxicillin-clavulanate (AUGMENTIN) 875-125 MG tablet  Every 12 hours        05/22/23 2002              Virgina Norfolk, DO 05/22/23 2003

## 2023-05-22 NOTE — ED Triage Notes (Signed)
Per [facility] pt coming from home UC c/o left sided abdominal pain x 2 weeks. Patient reports having abdominal pain for a long time however worse over the past 2 weeks. Has appt in August for imaging. Last BM yesterday.

## 2023-05-22 NOTE — Discharge Instructions (Signed)
Take next dose of antibiotic tomorrow morning.  Follow-up with your primary care doctor in 4 days.  Return if symptoms worsen including fever, worsening pain.  Overall today you were diagnosed with may be a mild infection in your colon.

## 2023-05-30 ENCOUNTER — Other Ambulatory Visit (HOSPITAL_COMMUNITY): Payer: Self-pay

## 2023-05-30 MED ORDER — LISINOPRIL 10 MG PO TABS
10.0000 mg | ORAL_TABLET | Freq: Every day | ORAL | 0 refills | Status: AC
  Filled 2023-05-30: qty 90, 90d supply, fill #0

## 2023-06-01 ENCOUNTER — Other Ambulatory Visit: Payer: Medicaid Other

## 2023-06-11 ENCOUNTER — Other Ambulatory Visit (HOSPITAL_COMMUNITY): Payer: Self-pay

## 2023-06-12 ENCOUNTER — Other Ambulatory Visit: Payer: Self-pay

## 2023-06-12 ENCOUNTER — Other Ambulatory Visit (HOSPITAL_COMMUNITY): Payer: Self-pay

## 2023-06-12 MED ORDER — ATORVASTATIN CALCIUM 20 MG PO TABS
20.0000 mg | ORAL_TABLET | Freq: Every evening | ORAL | 0 refills | Status: AC
  Filled 2023-06-12: qty 90, 90d supply, fill #0

## 2023-06-19 ENCOUNTER — Other Ambulatory Visit (HOSPITAL_COMMUNITY): Payer: Self-pay

## 2023-06-19 ENCOUNTER — Ambulatory Visit (INDEPENDENT_AMBULATORY_CARE_PROVIDER_SITE_OTHER): Payer: Medicaid Other

## 2023-06-19 ENCOUNTER — Ambulatory Visit (HOSPITAL_COMMUNITY)
Admission: EM | Admit: 2023-06-19 | Discharge: 2023-06-19 | Disposition: A | Discharge status: Home / Self Care | Payer: Medicaid Other | Attending: Physician Assistant | Admitting: Physician Assistant

## 2023-06-19 ENCOUNTER — Encounter (HOSPITAL_COMMUNITY): Payer: Self-pay

## 2023-06-19 DIAGNOSIS — M5442 Lumbago with sciatica, left side: Secondary | ICD-10-CM

## 2023-06-19 DIAGNOSIS — M5441 Lumbago with sciatica, right side: Secondary | ICD-10-CM

## 2023-06-19 DIAGNOSIS — G8929 Other chronic pain: Secondary | ICD-10-CM | POA: Diagnosis not present

## 2023-06-19 MED ORDER — LIDOCAINE 5 % EX PTCH
1.0000 | MEDICATED_PATCH | CUTANEOUS | 0 refills | Status: DC
  Filled 2023-06-19: qty 30, 30d supply, fill #0

## 2023-06-19 MED ORDER — KETOROLAC TROMETHAMINE 30 MG/ML IJ SOLN
30.0000 mg | Freq: Once | INTRAMUSCULAR | Status: AC
  Administered 2023-06-19: 30 mg via INTRAMUSCULAR

## 2023-06-19 MED ORDER — KETOROLAC TROMETHAMINE 30 MG/ML IJ SOLN
INTRAMUSCULAR | Status: AC
  Filled 2023-06-19: qty 1
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
## 2023-06-19 NOTE — ED Triage Notes (Signed)
Pt presents with c/o bilateral back pain and numbness in legs X 2 weeks.

## 2023-06-19 NOTE — Discharge Instructions (Signed)
I will contact you if the radiologist sees anything else on your x-ray.  As we discussed, you have a lot of arthritis.  I would like you to follow-up with orthopedics as soon as possible.  [facility] should have walk-in clinic from 8 AM to 8 PM Monday, Tuesday, Wednesday, Thursday, Friday and from 10 AM to 3 PM on Saturday.  I have given you injection of Toradol today so please not take any NSAIDs for the next 24 hours.  You can continue your previously prescribed medication.  I have called lidocaine patches to the pharmacy that you can use during the day and then remove them at night.  If you have any worsening or changing symptoms including increasing pain, difficulty walking, numbness or tingling in your legs, going to the bathroom and yourself without noticing it, fever you need to go to the emergency room.  Nitawasiliana nawe ikiwa mtaalamu wa radiolojia ataona kitu kingine chochote kwenye eksirei yako.  Kama tulivyojadili, una ugonjwa wa arthritis.  Ningependa ufuatilie madaktari wa mifupa Floyde Parkins.  [facility] inapaswa [place] na kliniki ya [place] 8 AM hadi 8 PM [place], [place], [place], [place], [place] na kutoka 10 asubuhi hadi 3 PM [place].  Nimekudunga sindano ya Toradol leo kwa hivyo tafadhali usinywe NSAID zozote kwa saa 24 zijazo.  Albin Fischer na dawa [employer] hapo awali.  Nimeita patches za lidocaine kwenye duka la dawa Nathanial Millman kutumia wakati wa mchana na [place] usiku.  Lavonna Monarch una dalili zozote zinazozidi [employer] au [place] ni pamoja na [place] kwa maumivu, ugumu wa [place], [place] au [place] kwa miguu, [place] chooni na wewe [name] bila [name], [place] unahitaji kwenda kwenye chumba cha dharura.

## 2023-06-20 ENCOUNTER — Other Ambulatory Visit (HOSPITAL_COMMUNITY): Payer: Self-pay

## 2023-06-23 ENCOUNTER — Other Ambulatory Visit (HOSPITAL_COMMUNITY): Payer: Self-pay

## 2023-06-26 ENCOUNTER — Other Ambulatory Visit (HOSPITAL_COMMUNITY): Payer: Self-pay

## 2023-06-26 MED ORDER — HYDROCODONE-ACETAMINOPHEN 10-325 MG PO TABS
1.0000 | ORAL_TABLET | Freq: Three times a day (TID) | ORAL | 0 refills | Status: AC | PRN
Start: 2023-06-23 — End: 2023-07-01
  Filled 2023-06-26: qty 15, 5d supply, fill #0

## 2023-06-27 ENCOUNTER — Other Ambulatory Visit (HOSPITAL_COMMUNITY): Payer: Self-pay

## 2023-07-04 ENCOUNTER — Other Ambulatory Visit (HOSPITAL_COMMUNITY): Payer: Self-pay

## 2023-07-11 ENCOUNTER — Other Ambulatory Visit (HOSPITAL_COMMUNITY): Payer: Self-pay

## 2023-07-12 ENCOUNTER — Other Ambulatory Visit (HOSPITAL_COMMUNITY): Payer: Self-pay

## 2023-07-13 ENCOUNTER — Other Ambulatory Visit (HOSPITAL_COMMUNITY): Payer: Self-pay

## 2023-07-23 ENCOUNTER — Other Ambulatory Visit (HOSPITAL_COMMUNITY): Payer: Self-pay

## 2023-07-23 ENCOUNTER — Encounter (HOSPITAL_COMMUNITY): Payer: Self-pay

## 2023-07-23 ENCOUNTER — Ambulatory Visit (HOSPITAL_COMMUNITY)
Admission: EM | Admit: 2023-07-23 | Discharge: 2023-07-23 | Disposition: A | Discharge status: Home / Self Care | Payer: Medicaid Other | Attending: Family Medicine | Admitting: Family Medicine

## 2023-07-23 DIAGNOSIS — M545 Low back pain, unspecified: Secondary | ICD-10-CM

## 2023-07-23 MED ORDER — IBUPROFEN 600 MG PO TABS
600.0000 mg | ORAL_TABLET | Freq: Three times a day (TID) | ORAL | 0 refills | Status: DC | PRN
  Filled 2023-07-23: qty 30, 10d supply, fill #0

## 2023-07-23 MED ORDER — KETOROLAC TROMETHAMINE 30 MG/ML IJ SOLN: 30.0000 mg | Freq: Once | INTRAMUSCULAR | Status: DC

## 2023-07-23 MED ORDER — KETOROLAC TROMETHAMINE 30 MG/ML IJ SOLN
INTRAMUSCULAR | Status: AC
  Filled 2023-07-23: qty 1

## 2023-07-23 MED ORDER — CYCLOBENZAPRINE HCL 10 MG PO TABS
10.0000 mg | ORAL_TABLET | Freq: Three times a day (TID) | ORAL | 0 refills | Status: AC | PRN
  Filled 2023-07-23: qty 90, 30d supply, fill #0

## 2023-07-23 MED ORDER — CYCLOBENZAPRINE HCL 10 MG PO TABS
10.0000 mg | ORAL_TABLET | Freq: Three times a day (TID) | ORAL | 0 refills | Status: DC | PRN
  Filled 2023-07-23: qty 90, 30d supply, fill #0

## 2023-07-23 MED ORDER — KETOROLAC TROMETHAMINE 30 MG/ML IJ SOLN
30.0000 mg | Freq: Once | INTRAMUSCULAR | Status: AC
  Administered 2023-07-23: 30 mg via INTRAMUSCULAR

## 2023-07-23 NOTE — ED Triage Notes (Addendum)
Per Interpreter [phone]  Patient c/o bilateral lower back pain and numbness to the legs. Patient was seen on 07/20/23 for the same.   Patient has an appointment with [facility] in 2 weeks.

## 2023-07-23 NOTE — Discharge Instructions (Addendum)
Please use your Flexeril as needed for your back spasm.  Please apply heat over the affected area, I would not use ice.  Please use ibuprofen 600 mg 2-3 times a day for the next 2 weeks.  Please follow-up with [facility].   Tafadhali tumia Flexeril yako inavyohitajika kwa mshtuko wa mgongo wako.  Tafadhali weka joto kwenye eneo lililoathiriwa, singetumia barafu.  Tafadhali tumia ibuprofen 600 mg mara 2-3 kwa siku kwa wiki 2 zijazo.  Tafadhali fuatilia na [facility].
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
## 2023-08-01 ENCOUNTER — Other Ambulatory Visit (HOSPITAL_COMMUNITY): Payer: Self-pay

## 2023-08-01 MED ORDER — HYDROCODONE-ACETAMINOPHEN 10-325 MG PO TABS
1.0000 | ORAL_TABLET | Freq: Three times a day (TID) | ORAL | 0 refills | Status: AC | PRN
Start: 2023-08-01 — End: ?
  Filled 2023-08-01: qty 15, 5d supply, fill #0

## 2023-08-07 ENCOUNTER — Encounter (HOSPITAL_COMMUNITY): Payer: Self-pay | Admitting: *Deleted

## 2023-08-07 ENCOUNTER — Ambulatory Visit (HOSPITAL_COMMUNITY)
Admission: EM | Admit: 2023-08-07 | Discharge: 2023-08-07 | Disposition: A | Discharge status: Home / Self Care | Payer: Medicaid Other | Attending: Emergency Medicine | Admitting: Emergency Medicine

## 2023-08-07 DIAGNOSIS — M5442 Lumbago with sciatica, left side: Secondary | ICD-10-CM | POA: Diagnosis not present

## 2023-08-07 DIAGNOSIS — G8929 Other chronic pain: Secondary | ICD-10-CM

## 2023-08-07 DIAGNOSIS — M5441 Lumbago with sciatica, right side: Secondary | ICD-10-CM | POA: Diagnosis not present

## 2023-08-07 HISTORY — DX: Essential (primary) hypertension: I10

## 2023-08-07 HISTORY — DX: Abnormal levels of other serum enzymes: R74.8

## 2023-08-07 HISTORY — DX: Hyperlipidemia, unspecified: E78.5

## 2023-08-07 HISTORY — DX: Other spondylosis with myelopathy, cervical region: M47.12

## 2023-08-07 HISTORY — DX: Type 2 diabetes mellitus without complications: E11.9

## 2023-08-07 MED ORDER — DEXAMETHASONE SODIUM PHOSPHATE 10 MG/ML IJ SOLN
INTRAMUSCULAR | Status: AC
  Filled 2023-08-07: qty 1

## 2023-08-07 MED ORDER — DEXAMETHASONE SODIUM PHOSPHATE 10 MG/ML IJ SOLN
10.0000 mg | Freq: Once | INTRAMUSCULAR | Status: AC
  Administered 2023-08-07: 10 mg via INTRAMUSCULAR

## 2023-08-07 NOTE — ED Triage Notes (Signed)
Adult nurse used for clinical intake.   Pt states he still has the lower back pain and bilateral numbness to his legs. He did see [facility] and was referred to a neurosurgeon but hasn't made an appt yet. He is taking gabapentin unsure of Mg but states he takes 8 pills in a day.   He states that when he takes IBU 600mg  it makes his eyes swell so he can't take that any longer.    He states last time he was here he got a work note to be out for 2 weeks until he got into [facility] and now he would like a new note to continue to take him out of work.
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
## 2023-08-07 NOTE — Discharge Instructions (Addendum)
As discussed you have received a steroid shot in clinic today to decrease inflammation to help with your pain.  Continue taking recently prescribed medications as prescribed to help with pain.  Please call [facility] today to get direct number for neurosurgeon to follow-up with regarding persistent pain and numbness.  Call the neurosurgeon today to get an appointment set up with them.  If your pain persists you can return here or return to [facility] for reevaluation.  We cannot fill out ADA paperwork for you to be out of work for an extended period of time at the urgent care.  Your primary care doctor or a specialist would have to fill out this paperwork.  Kama ilivyojadiliwa umepokea risasi ya steroid katika kliniki leo ili [place] uvimbe ili kusaidia na maumivu yako.  Gloris Manchester Henrene Pastor dawa zilizoagizwa hivi Aliene Beams ilivyoagizwa ili kusaidia na maumivu.  Tafadhali piga simu kwa [facility] leo ili [place] nambari ya moja kwa moja kwa daktari wa upasuaji wa neva ili kufuatilia kuhusu maumivu na [place].  Piga simu kwa daktari wa upasuaji wa neva leo ili uweke miadi pamoja naye.  Maumivu yako yakiendelea unaweza kurudi hapa au urudi kwa [facility] ili [place].  Hatuwezi [employer] makaratasi ya ADA ili uache kazi kwa muda mrefu katika huduma ya dharura.  Daktari wako wa huduma ya msingi au mtaalamu atalazimika kujaza karatasi hizi.

## 2023-08-10 ENCOUNTER — Ambulatory Visit (HOSPITAL_COMMUNITY)
Admission: EM | Admit: 2023-08-10 | Discharge: 2023-08-10 | Discharge status: Against Medical Advice | Payer: Medicaid Other

## 2023-08-10 ENCOUNTER — Encounter (HOSPITAL_COMMUNITY): Payer: Self-pay

## 2023-08-10 NOTE — ED Notes (Signed)
Pt left prior to being seen by provider due to wait time.

## 2023-08-10 NOTE — ED Triage Notes (Signed)
Translator used. Merlyn Albert #[id number]  Pt presents to urgent care today with chronic back pain ("going on for a long time") with numbness in bilateral hands/legs. Pt reports weakness and inability to work due to pain. Last dose of gabapentin was yesterday evening @ 2030. No other medications taken.

## 2023-09-10 ENCOUNTER — Other Ambulatory Visit (HOSPITAL_COMMUNITY): Payer: Self-pay | Admitting: Student

## 2023-09-10 DIAGNOSIS — G959 Disease of spinal cord, unspecified: Secondary | ICD-10-CM

## 2023-09-10 DIAGNOSIS — M542 Cervicalgia: Secondary | ICD-10-CM

## 2023-09-12 ENCOUNTER — Other Ambulatory Visit: Payer: Self-pay

## 2023-09-12 ENCOUNTER — Encounter (HOSPITAL_COMMUNITY): Payer: Self-pay

## 2023-09-12 ENCOUNTER — Emergency Department (HOSPITAL_COMMUNITY)
Admission: EM | Admit: 2023-09-12 | Discharge: 2023-09-12 | Disposition: A | Discharge status: Home / Self Care | Payer: Medicaid Other | Attending: Emergency Medicine | Admitting: Emergency Medicine

## 2023-09-12 DIAGNOSIS — D649 Anemia, unspecified: Secondary | ICD-10-CM | POA: Insufficient documentation

## 2023-09-12 DIAGNOSIS — R7309 Other abnormal glucose: Secondary | ICD-10-CM | POA: Insufficient documentation

## 2023-09-12 DIAGNOSIS — I959 Hypotension, unspecified: Secondary | ICD-10-CM | POA: Diagnosis present

## 2023-09-12 LAB — CBC WITH DIFFERENTIAL/PLATELET
Abs Immature Granulocytes: 0.04 10*3/uL (ref 0.00–0.07)
Basophils Absolute: 0 10*3/uL (ref 0.0–0.1)
Basophils Relative: 1 %
Eosinophils Absolute: 0.1 10*3/uL (ref 0.0–0.5)
Eosinophils Relative: 1 %
HCT: 29.9 % — ABNORMAL LOW (ref 39.0–52.0)
Hemoglobin: 10 g/dL — ABNORMAL LOW (ref 13.0–17.0)
Immature Granulocytes: 1 %
Lymphocytes Relative: 31 %
Lymphs Abs: 1.6 10*3/uL (ref 0.7–4.0)
MCH: 30.3 pg (ref 26.0–34.0)
MCHC: 33.4 g/dL (ref 30.0–36.0)
MCV: 90.6 fL (ref 80.0–100.0)
Monocytes Absolute: 0.5 10*3/uL (ref 0.1–1.0)
Monocytes Relative: 10 %
Neutro Abs: 2.8 10*3/uL (ref 1.7–7.7)
Neutrophils Relative %: 56 %
Platelets: 128 10*3/uL — ABNORMAL LOW (ref 150–400)
RBC: 3.3 MIL/uL — ABNORMAL LOW (ref 4.22–5.81)
RDW: 17.1 % — ABNORMAL HIGH (ref 11.5–15.5)
Smear Review: DECREASED
WBC: 5 10*3/uL (ref 4.0–10.5)
nRBC: 1.8 % — ABNORMAL HIGH (ref 0.0–0.2)

## 2023-09-12 LAB — BASIC METABOLIC PANEL
Anion gap: 10 (ref 5–15)
BUN: 11 mg/dL (ref 6–20)
CO2: 19 mmol/L — ABNORMAL LOW (ref 22–32)
Calcium: 8.4 mg/dL — ABNORMAL LOW (ref 8.9–10.3)
Chloride: 102 mmol/L (ref 98–111)
Creatinine, Ser: 0.82 mg/dL (ref 0.61–1.24)
GFR, Estimated: >60 mL/min (ref >60)
Glucose, Bld: 147 mg/dL — ABNORMAL HIGH (ref 70–99)
Potassium: 3.7 mmol/L (ref 3.5–5.1)
Sodium: 131 mmol/L — ABNORMAL LOW (ref 135–145)

## 2023-09-12 LAB — CBG MONITORING, ED: Glucose-Capillary: 169 mg/dL — ABNORMAL HIGH (ref 70–99)

## 2023-09-12 NOTE — ED Notes (Signed)
RN using swahili translator to triage pt

## 2023-09-12 NOTE — ED Triage Notes (Signed)
Pt BIB EMS. EMS found pt unresponsive in a chair and no radial pulses. GCS originally 10. BP 64/40. BS 207. EMS gave 1L of NS. Pt arrives axox4.

## 2023-09-12 NOTE — Discharge Instructions (Addendum)
Your blood work showed low hemoglobin and low platelets.  This should be followed up as an outpatient with your primary doctor.  If you have any blood in the urine, rectal bleeding, black stools you need to be evaluated.  Otherwise your workup was normal, your blood pressure has been normal during your visit.  This episode could have been secondary to taking combination of over sedating medication.

## 2023-09-12 NOTE — ED Provider Notes (Signed)
[facility] EMERGENCY DEPARTMENT AT [facility] Provider Note   CSN: [id number] Arrival date & time: 09/12/23  1642     History  Chief Complaint  Patient presents with   Hypotension    Neno Askin is a 57 y.o. male.  HPI   57 year old Swahili speaking male presents to the emergency department after patient was found slumped over in a chair.  Report from EMS is that initial blood pressure was low with systolic in the 60s, improved after liter of fluid.  Patient is a stoic and poor historian, interpreter services used.  He states that he is been in his baseline health.  He has chronic ongoing lower back pain that radiates to the legs, this is not acutely changed.  He admits that he is on multiple medications that he should be taking 3 times a day for pain/symptom control in regards to the back pain.  However recently one of the doctor stopped one of his medications.  But he states that he did not comply with this and took his dose in the afternoon which is what he believes made him feel dizzy/sleepy.  Chart review shows that he is on an opiate, muscle relaxer and gabapentin.  Patient confirms this.  He otherwise denies any headache, focal neurologic complaint, chest pain or palpitations.  He currently feels back to baseline.  Home Medications Prior to Admission medications   Medication Sig Start Date End Date Taking? Authorizing Provider  atorvastatin (LIPITOR) 20 MG tablet Take 1 tablet (20 mg total) by mouth at bedtime. 06/12/23     cyclobenzaprine (FLEXERIL) 10 MG tablet Take 1 tablet (10 mg total) by mouth 3 (three) times daily as needed for muscle spasms. 07/23/23   Brenton Grills, MD  gabapentin (NEURONTIN) 300 MG capsule Take 1 capsule (300 mg total) by mouth 3 (three) times daily. 03/27/23     HYDROcodone-acetaminophen (NORCO) 10-325 MG tablet Take 1 tablet by mouth 3 (three) times daily as needed for pain for 10 days 08/01/23     lisinopril (ZESTRIL) 10 MG tablet Take 1 tablet  (10 mg total) by mouth daily. 05/30/23     mirtazapine (REMERON) 7.5 MG tablet Take 1 tablet (7.5 mg total) by mouth at bedtime. 01/05/22         Allergies    Grass pollen([id number] vern)    Review of Systems   Review of Systems  Constitutional:  Negative for fever.  Respiratory:  Negative for shortness of breath.   Cardiovascular:  Negative for chest pain.  Gastrointestinal:  Negative for diarrhea and vomiting.  Musculoskeletal:  Positive for back pain. Negative for neck pain.  Skin:  Negative for rash.  Neurological:  Negative for seizures, speech difficulty, weakness and headaches.    Physical Exam Updated Vital Signs BP 116/78   Pulse 70   Temp 97.6 F (36.4 C) (Oral)   Resp 15   Ht 5' 1.02" (1.55 m)   Wt 53 kg   SpO2 100%   BMI 22.06 kg/m  Physical Exam Vitals and nursing note reviewed.  Constitutional:      General: He is not in acute distress.    Appearance: Normal appearance.  HENT:     Head: Normocephalic.     Mouth/Throat:     Mouth: Mucous membranes are moist.  Cardiovascular:     Rate and Rhythm: Normal rate.  Pulmonary:     Effort: Pulmonary effort is normal. No respiratory distress.  Abdominal:     Palpations: Abdomen is  soft.     Tenderness: There is no abdominal tenderness.  Skin:    General: Skin is warm.  Neurological:     Mental Status: He is alert and oriented to person, place, and time. Mental status is at baseline.  Psychiatric:        Mood and Affect: Mood normal.     ED Results / Procedures / Treatments   Labs (all labs ordered are listed, but only abnormal results are displayed) Labs Reviewed  BASIC METABOLIC PANEL - Abnormal; Notable for the following components:      Result Value   Sodium 131 (*)    CO2 19 (*)    Glucose, Bld 147 (*)    Calcium 8.4 (*)    All other components within normal limits  CBG MONITORING, ED - Abnormal; Notable for the following components:   Glucose-Capillary 169 (*)    All other components within  normal limits  CBC WITH DIFFERENTIAL/PLATELET    EKG EKG Interpretation Date/Time:  Wednesday September 12 2023 17:31:30 EST Ventricular Rate:  71 PR Interval:  140 QRS Duration:  105 QT Interval:  423 QTC Calculation: 460 R Axis:   65  Text Interpretation: Sinus rhythm Confirmed by Coralee Pesa [phone]) on 09/12/2023 5:57:34 PM  Radiology No results found.  Procedures Procedures    Medications Ordered in ED Medications - No data to display  ED Course/ Medical Decision Making/ A&P                                 Medical Decision Making Amount and/or Complexity of Data Reviewed Labs: ordered.   57 year old male presents emergency department after being found sleeping in a chair with concern for low blood pressure, improved on arrival with a small amount of IV fluids.  Patient is sitting up, poor and stoic historian with interpreter services.  He admits to taking his prescribed medication for his back pain although it has been requested that he does not take all 3 of his medications together.  He is prescribed an opiate, gabapentin and muscle relaxer.  He states after taking an additional doses when he started feeling dizzy/tired.  This is most likely a result of polypharmacy with sedating medications.  Vitals are normal and stable here, he had appropriate blood pressure without any further intervention.  He denies any other acute symptoms.  Blood work here shows an anemia with mild thrombocytopenia.  Reviewed these results with the patient.  He denies any signs of bleeding.  No hematuria, black stools or acute symptoms of anemia.  Rectal exam deferred.  I doubt at this time that this anemia was the source of his symptoms with the admitted medication combination/use.  There is no tachycardia or ongoing hypotension.  Will recommend outpatient follow-up and repeat lab work.  Patient at this time appears safe and stable for discharge and close outpatient follow up. Discharge plan  and strict return to ED precautions discussed, patient verbalizes understanding and agreement.        Final Clinical Impression(s) / ED Diagnoses Final diagnoses:  None    Rx / DC Orders ED Discharge Orders     None         Rozelle Logan, DO 09/12/23 2147

## 2023-09-18 ENCOUNTER — Ambulatory Visit (HOSPITAL_COMMUNITY)
Admission: RE | Admit: 2023-09-18 | Discharge: 2023-09-18 | Disposition: A | Discharge status: Home / Self Care | Payer: Medicaid Other | Source: Ambulatory Visit | Attending: Student

## 2023-09-18 ENCOUNTER — Ambulatory Visit (HOSPITAL_COMMUNITY)
Admission: RE | Admit: 2023-09-18 | Discharge: 2023-09-18 | Disposition: A | Discharge status: Home / Self Care | Payer: Medicaid Other | Source: Ambulatory Visit | Attending: Student | Admitting: Student

## 2023-09-18 DIAGNOSIS — G959 Disease of spinal cord, unspecified: Secondary | ICD-10-CM

## 2023-09-18 DIAGNOSIS — M542 Cervicalgia: Secondary | ICD-10-CM | POA: Insufficient documentation

## 2023-11-19 ENCOUNTER — Other Ambulatory Visit (HOSPITAL_COMMUNITY): Payer: Self-pay

## 2023-11-19 MED ORDER — LISINOPRIL 10 MG PO TABS
10.0000 mg | ORAL_TABLET | Freq: Every day | ORAL | 0 refills | Status: DC
  Filled 2023-11-19: qty 90, 90d supply, fill #0

## 2023-11-19 MED ORDER — METHOCARBAMOL 500 MG PO TABS
500.0000 mg | ORAL_TABLET | Freq: Three times a day (TID) | ORAL | 3 refills | Status: AC
  Filled 2023-11-19: qty 90, 30d supply, fill #0
  Filled 2023-12-28: qty 90, 30d supply, fill #1

## 2023-11-22 ENCOUNTER — Other Ambulatory Visit (HOSPITAL_COMMUNITY): Payer: Self-pay

## 2023-11-26 ENCOUNTER — Other Ambulatory Visit (HOSPITAL_COMMUNITY): Payer: Self-pay

## 2023-11-26 MED ORDER — ATORVASTATIN CALCIUM 20 MG PO TABS
20.0000 mg | ORAL_TABLET | Freq: Every day | ORAL | 3 refills | Status: AC
  Filled 2023-11-26: qty 90, 90d supply, fill #0

## 2023-11-26 MED ORDER — VITAMIN D (ERGOCALCIFEROL) 1.25 MG (50000 UNIT) PO CAPS
50000.0000 [IU] | ORAL_CAPSULE | ORAL | 1 refills | Status: AC
  Filled 2023-11-26: qty 12, 84d supply, fill #0

## 2023-12-03 ENCOUNTER — Other Ambulatory Visit (HOSPITAL_COMMUNITY): Payer: Self-pay

## 2023-12-03 MED ORDER — METHOCARBAMOL 500 MG PO TABS
500.0000 mg | ORAL_TABLET | Freq: Four times a day (QID) | ORAL | 0 refills | Status: DC | PRN
  Filled 2023-12-03: qty 60, 15d supply, fill #0

## 2023-12-04 ENCOUNTER — Other Ambulatory Visit (HOSPITAL_COMMUNITY): Payer: Self-pay

## 2023-12-28 ENCOUNTER — Other Ambulatory Visit (HOSPITAL_COMMUNITY): Payer: Self-pay

## 2023-12-28 MED ORDER — GABAPENTIN 300 MG PO CAPS
300.0000 mg | ORAL_CAPSULE | Freq: Three times a day (TID) | ORAL | 3 refills | Status: AC
  Filled 2023-12-28 (×2): qty 270, 90d supply, fill #0

## 2023-12-28 MED ORDER — HYOSCYAMINE SULFATE 0.125 MG PO TBDP
0.1250 mg | ORAL_TABLET | Freq: Two times a day (BID) | ORAL | 3 refills | Status: AC | PRN
  Filled 2023-12-28: qty 90, 45d supply, fill #0

## 2023-12-28 MED ORDER — CETIRIZINE HCL 10 MG PO TABS
10.0000 mg | ORAL_TABLET | Freq: Every day | ORAL | 3 refills | Status: AC
  Filled 2023-12-28: qty 90, 90d supply, fill #0

## 2023-12-31 ENCOUNTER — Other Ambulatory Visit (HOSPITAL_COMMUNITY): Payer: Self-pay

## 2023-12-31 MED ORDER — METHOCARBAMOL 500 MG PO TABS: 500.0000 mg | ORAL_TABLET | Freq: Three times a day (TID) | ORAL | 3 refills | Status: AC

## 2024-01-04 ENCOUNTER — Other Ambulatory Visit (HOSPITAL_COMMUNITY): Payer: Self-pay

## 2024-08-07 ENCOUNTER — Other Ambulatory Visit (HOSPITAL_COMMUNITY): Payer: Self-pay

## 2024-08-07 MED ORDER — HYDROCODONE-ACETAMINOPHEN 10-325 MG PO TABS
1.0000 | ORAL_TABLET | Freq: Three times a day (TID) | ORAL | 0 refills | Status: DC | PRN
  Filled 2024-08-07: qty 15, 5d supply, fill #0

## 2024-08-07 MED ORDER — POLYETHYLENE GLYCOL 3350 17 G PO PACK
17.0000 g | PACK | Freq: Every day | ORAL | 0 refills | Status: AC | PRN
  Filled 2024-08-07: qty 28, 28d supply, fill #0

## 2024-08-07 MED ORDER — METHOCARBAMOL 500 MG PO TABS
500.0000 mg | ORAL_TABLET | Freq: Four times a day (QID) | ORAL | 0 refills | Status: AC | PRN
  Filled 2024-08-07: qty 60, 15d supply, fill #0

## 2024-08-08 ENCOUNTER — Other Ambulatory Visit (HOSPITAL_COMMUNITY): Payer: Self-pay

## 2024-08-08 MED ORDER — LISINOPRIL 10 MG PO TABS
10.0000 mg | ORAL_TABLET | Freq: Every day | ORAL | 0 refills | Status: AC
  Filled 2024-08-08: qty 90, 90d supply, fill #0

## 2024-08-20 ENCOUNTER — Other Ambulatory Visit (HOSPITAL_COMMUNITY): Payer: Self-pay

## 2024-09-30 ENCOUNTER — Other Ambulatory Visit (HOSPITAL_COMMUNITY): Payer: Self-pay

## 2024-09-30 MED ORDER — METHOCARBAMOL 500 MG PO TABS
500.0000 mg | ORAL_TABLET | Freq: Four times a day (QID) | ORAL | 1 refills | Status: AC | PRN
  Filled 2024-09-30: qty 120, 30d supply, fill #0

## 2024-09-30 MED ORDER — GABAPENTIN 300 MG PO CAPS
300.0000 mg | ORAL_CAPSULE | Freq: Four times a day (QID) | ORAL | 1 refills | Status: AC
  Filled 2024-09-30: qty 120, 30d supply, fill #0

## 2024-10-01 ENCOUNTER — Other Ambulatory Visit (HOSPITAL_COMMUNITY): Payer: Self-pay
